# Patient Record
Sex: Female | Born: 1935 | Race: Black or African American | Hispanic: No | Smoking: Never smoker
Health system: Southern US, Community
[De-identification: ages and names within clinical notes are randomized; demographics above are authoritative.]

## PROBLEM LIST (undated history)

## (undated) DIAGNOSIS — N200 Calculus of kidney: Secondary | ICD-10-CM

## (undated) DIAGNOSIS — K859 Acute pancreatitis without necrosis or infection, unspecified: Secondary | ICD-10-CM

## (undated) DIAGNOSIS — I639 Cerebral infarction, unspecified: Secondary | ICD-10-CM

## (undated) DIAGNOSIS — Z89519 Acquired absence of unspecified leg below knee: Secondary | ICD-10-CM

## (undated) DIAGNOSIS — N390 Urinary tract infection, site not specified: Secondary | ICD-10-CM

## (undated) DIAGNOSIS — D649 Anemia, unspecified: Secondary | ICD-10-CM

## (undated) DIAGNOSIS — N183 Chronic kidney disease, stage 3 unspecified: Secondary | ICD-10-CM

## (undated) DIAGNOSIS — I10 Essential (primary) hypertension: Secondary | ICD-10-CM

## (undated) DIAGNOSIS — I4891 Unspecified atrial fibrillation: Secondary | ICD-10-CM

## (undated) HISTORY — PX: BELOW KNEE LEG AMPUTATION: SUR23

---

## 1998-01-30 ENCOUNTER — Ambulatory Visit (HOSPITAL_COMMUNITY): Admission: RE | Admit: 1998-01-30 | Discharge: 1998-01-30 | Payer: Self-pay | Admitting: Internal Medicine

## 1998-01-30 ENCOUNTER — Encounter: Admission: RE | Admit: 1998-01-30 | Discharge: 1998-01-30 | Payer: Self-pay | Admitting: Internal Medicine

## 1998-02-08 ENCOUNTER — Encounter: Payer: Self-pay | Admitting: Emergency Medicine

## 1998-02-08 ENCOUNTER — Emergency Department (HOSPITAL_COMMUNITY): Admission: EM | Admit: 1998-02-08 | Discharge: 1998-02-08 | Payer: Self-pay | Admitting: Emergency Medicine

## 1998-02-14 ENCOUNTER — Ambulatory Visit (HOSPITAL_COMMUNITY): Admission: RE | Admit: 1998-02-14 | Discharge: 1998-02-14 | Payer: Self-pay | Admitting: Orthopedic Surgery

## 1998-03-20 ENCOUNTER — Ambulatory Visit (HOSPITAL_COMMUNITY): Admission: RE | Admit: 1998-03-20 | Discharge: 1998-03-20 | Payer: Self-pay | Admitting: *Deleted

## 1998-03-20 ENCOUNTER — Encounter: Payer: Self-pay | Admitting: *Deleted

## 1998-04-26 ENCOUNTER — Encounter: Admission: RE | Admit: 1998-04-26 | Discharge: 1998-04-26 | Payer: Self-pay | Admitting: Internal Medicine

## 1998-05-17 ENCOUNTER — Encounter: Admission: RE | Admit: 1998-05-17 | Discharge: 1998-05-17 | Payer: Self-pay | Admitting: Internal Medicine

## 1998-05-18 ENCOUNTER — Emergency Department (HOSPITAL_COMMUNITY): Admission: EM | Admit: 1998-05-18 | Discharge: 1998-05-18 | Payer: Self-pay | Admitting: Emergency Medicine

## 1998-05-18 ENCOUNTER — Encounter: Payer: Self-pay | Admitting: Emergency Medicine

## 1998-06-06 ENCOUNTER — Encounter: Admission: RE | Admit: 1998-06-06 | Discharge: 1998-06-06 | Payer: Self-pay | Admitting: Internal Medicine

## 1999-04-05 ENCOUNTER — Encounter: Payer: Self-pay | Admitting: *Deleted

## 1999-04-05 ENCOUNTER — Ambulatory Visit (HOSPITAL_COMMUNITY): Admission: RE | Admit: 1999-04-05 | Discharge: 1999-04-05 | Payer: Self-pay | Admitting: *Deleted

## 2000-02-27 ENCOUNTER — Other Ambulatory Visit: Admission: RE | Admit: 2000-02-27 | Discharge: 2000-02-27 | Payer: Self-pay | Admitting: *Deleted

## 2000-04-07 ENCOUNTER — Ambulatory Visit (HOSPITAL_COMMUNITY): Admission: RE | Admit: 2000-04-07 | Discharge: 2000-04-07 | Payer: Self-pay | Admitting: *Deleted

## 2000-04-07 ENCOUNTER — Encounter: Payer: Self-pay | Admitting: *Deleted

## 2000-09-25 ENCOUNTER — Emergency Department (HOSPITAL_COMMUNITY): Admission: EM | Admit: 2000-09-25 | Discharge: 2000-09-25 | Payer: Self-pay | Admitting: Emergency Medicine

## 2002-01-25 ENCOUNTER — Encounter: Payer: Self-pay | Admitting: *Deleted

## 2002-01-25 ENCOUNTER — Encounter: Admission: RE | Admit: 2002-01-25 | Discharge: 2002-01-25 | Payer: Self-pay | Admitting: *Deleted

## 2002-01-27 ENCOUNTER — Ambulatory Visit: Admission: RE | Admit: 2002-01-27 | Discharge: 2002-01-27 | Payer: Self-pay | Admitting: *Deleted

## 2002-01-27 ENCOUNTER — Ambulatory Visit (HOSPITAL_BASED_OUTPATIENT_CLINIC_OR_DEPARTMENT_OTHER): Admission: RE | Admit: 2002-01-27 | Discharge: 2002-01-27 | Payer: Self-pay | Admitting: *Deleted

## 2002-05-18 ENCOUNTER — Encounter: Admission: RE | Admit: 2002-05-18 | Discharge: 2002-05-18 | Payer: Self-pay | Admitting: *Deleted

## 2002-05-18 ENCOUNTER — Encounter: Payer: Self-pay | Admitting: *Deleted

## 2002-05-19 ENCOUNTER — Ambulatory Visit (HOSPITAL_BASED_OUTPATIENT_CLINIC_OR_DEPARTMENT_OTHER): Admission: RE | Admit: 2002-05-19 | Discharge: 2002-05-19 | Payer: Self-pay | Admitting: *Deleted

## 2002-07-21 ENCOUNTER — Ambulatory Visit (HOSPITAL_BASED_OUTPATIENT_CLINIC_OR_DEPARTMENT_OTHER): Admission: RE | Admit: 2002-07-21 | Discharge: 2002-07-21 | Payer: Self-pay | Admitting: *Deleted

## 2003-02-25 ENCOUNTER — Other Ambulatory Visit: Admission: RE | Admit: 2003-02-25 | Discharge: 2003-02-25 | Payer: Self-pay | Admitting: Family Medicine

## 2004-04-02 ENCOUNTER — Ambulatory Visit (HOSPITAL_COMMUNITY): Admission: RE | Admit: 2004-04-02 | Discharge: 2004-04-02 | Payer: Self-pay | Admitting: Family Medicine

## 2004-04-04 ENCOUNTER — Encounter (INDEPENDENT_AMBULATORY_CARE_PROVIDER_SITE_OTHER): Payer: Self-pay | Admitting: Cardiology

## 2004-04-04 ENCOUNTER — Inpatient Hospital Stay (HOSPITAL_COMMUNITY): Admission: EM | Admit: 2004-04-04 | Discharge: 2004-04-06 | Payer: Self-pay | Admitting: Emergency Medicine

## 2005-05-06 ENCOUNTER — Emergency Department (HOSPITAL_COMMUNITY): Admission: EM | Admit: 2005-05-06 | Discharge: 2005-05-07 | Payer: Self-pay | Admitting: Emergency Medicine

## 2005-05-15 ENCOUNTER — Encounter: Admission: RE | Admit: 2005-05-15 | Discharge: 2005-05-15 | Payer: Self-pay | Admitting: Family Medicine

## 2005-06-06 ENCOUNTER — Encounter (HOSPITAL_BASED_OUTPATIENT_CLINIC_OR_DEPARTMENT_OTHER): Admission: RE | Admit: 2005-06-06 | Discharge: 2005-07-12 | Payer: Self-pay | Admitting: Surgery

## 2005-06-10 ENCOUNTER — Ambulatory Visit (HOSPITAL_COMMUNITY): Admission: RE | Admit: 2005-06-10 | Discharge: 2005-06-10 | Payer: Self-pay | Admitting: Surgery

## 2005-06-17 ENCOUNTER — Ambulatory Visit (HOSPITAL_COMMUNITY): Admission: RE | Admit: 2005-06-17 | Discharge: 2005-06-17 | Payer: Self-pay | Admitting: Surgery

## 2005-07-12 ENCOUNTER — Encounter (HOSPITAL_BASED_OUTPATIENT_CLINIC_OR_DEPARTMENT_OTHER): Admission: RE | Admit: 2005-07-12 | Discharge: 2005-08-01 | Payer: Self-pay | Admitting: Surgery

## 2005-07-27 ENCOUNTER — Emergency Department (HOSPITAL_COMMUNITY): Admission: EM | Admit: 2005-07-27 | Discharge: 2005-07-28 | Payer: Self-pay | Admitting: Emergency Medicine

## 2005-07-29 ENCOUNTER — Ambulatory Visit (HOSPITAL_COMMUNITY): Admission: RE | Admit: 2005-07-29 | Discharge: 2005-07-29 | Payer: Self-pay | Admitting: Surgery

## 2005-08-02 ENCOUNTER — Encounter (HOSPITAL_BASED_OUTPATIENT_CLINIC_OR_DEPARTMENT_OTHER): Admission: RE | Admit: 2005-08-02 | Discharge: 2005-08-22 | Payer: Self-pay | Admitting: Surgery

## 2005-09-30 ENCOUNTER — Encounter: Payer: Self-pay | Admitting: Vascular Surgery

## 2005-09-30 ENCOUNTER — Inpatient Hospital Stay (HOSPITAL_COMMUNITY): Admission: AD | Admit: 2005-09-30 | Discharge: 2005-10-09 | Payer: Self-pay | Admitting: Internal Medicine

## 2005-10-06 ENCOUNTER — Encounter (INDEPENDENT_AMBULATORY_CARE_PROVIDER_SITE_OTHER): Payer: Self-pay | Admitting: *Deleted

## 2005-10-08 ENCOUNTER — Ambulatory Visit: Payer: Self-pay | Admitting: Physical Medicine & Rehabilitation

## 2005-10-09 ENCOUNTER — Inpatient Hospital Stay (HOSPITAL_COMMUNITY)
Admission: RE | Admit: 2005-10-09 | Discharge: 2005-10-18 | Payer: Self-pay | Admitting: Physical Medicine & Rehabilitation

## 2005-12-02 ENCOUNTER — Ambulatory Visit: Payer: Self-pay | Admitting: Physical Medicine & Rehabilitation

## 2005-12-02 ENCOUNTER — Encounter
Admission: RE | Admit: 2005-12-02 | Discharge: 2006-03-02 | Payer: Self-pay | Admitting: Physical Medicine & Rehabilitation

## 2005-12-03 ENCOUNTER — Encounter
Admission: RE | Admit: 2005-12-03 | Discharge: 2005-12-03 | Payer: Self-pay | Admitting: Physical Medicine & Rehabilitation

## 2006-01-20 ENCOUNTER — Encounter
Admission: RE | Admit: 2006-01-20 | Discharge: 2006-03-02 | Payer: Self-pay | Admitting: Physical Medicine & Rehabilitation

## 2006-01-31 ENCOUNTER — Ambulatory Visit: Payer: Self-pay | Admitting: Physical Medicine & Rehabilitation

## 2006-01-31 ENCOUNTER — Encounter
Admission: RE | Admit: 2006-01-31 | Discharge: 2006-05-01 | Payer: Self-pay | Admitting: Physical Medicine & Rehabilitation

## 2006-03-03 ENCOUNTER — Encounter
Admission: RE | Admit: 2006-03-03 | Discharge: 2006-06-01 | Payer: Self-pay | Admitting: Physical Medicine & Rehabilitation

## 2006-05-15 ENCOUNTER — Ambulatory Visit: Payer: Self-pay | Admitting: Physical Medicine & Rehabilitation

## 2006-07-25 ENCOUNTER — Inpatient Hospital Stay (HOSPITAL_COMMUNITY): Admission: EM | Admit: 2006-07-25 | Discharge: 2006-07-28 | Payer: Self-pay | Admitting: Emergency Medicine

## 2006-08-12 ENCOUNTER — Ambulatory Visit: Admission: RE | Admit: 2006-08-12 | Discharge: 2006-08-12 | Payer: Self-pay | Admitting: Gynecology

## 2006-08-15 ENCOUNTER — Ambulatory Visit: Payer: Self-pay | Admitting: Physical Medicine & Rehabilitation

## 2006-08-15 ENCOUNTER — Encounter
Admission: RE | Admit: 2006-08-15 | Discharge: 2006-10-07 | Payer: Self-pay | Admitting: Physical Medicine & Rehabilitation

## 2006-12-02 ENCOUNTER — Encounter
Admission: RE | Admit: 2006-12-02 | Discharge: 2006-12-09 | Payer: Self-pay | Admitting: Physical Medicine & Rehabilitation

## 2006-12-02 ENCOUNTER — Ambulatory Visit: Payer: Self-pay | Admitting: Physical Medicine & Rehabilitation

## 2007-03-05 ENCOUNTER — Encounter
Admission: RE | Admit: 2007-03-05 | Discharge: 2007-06-03 | Payer: Self-pay | Admitting: Physical Medicine & Rehabilitation

## 2007-03-27 ENCOUNTER — Ambulatory Visit: Payer: Self-pay | Admitting: Physical Medicine & Rehabilitation

## 2007-06-18 ENCOUNTER — Encounter
Admission: RE | Admit: 2007-06-18 | Discharge: 2007-06-19 | Payer: Self-pay | Admitting: Physical Medicine & Rehabilitation

## 2007-06-19 ENCOUNTER — Ambulatory Visit: Payer: Self-pay | Admitting: Physical Medicine & Rehabilitation

## 2007-09-02 ENCOUNTER — Emergency Department (HOSPITAL_COMMUNITY): Admission: EM | Admit: 2007-09-02 | Discharge: 2007-09-03 | Payer: Self-pay | Admitting: Emergency Medicine

## 2007-10-16 ENCOUNTER — Encounter
Admission: RE | Admit: 2007-10-16 | Discharge: 2007-10-19 | Payer: Self-pay | Admitting: Physical Medicine & Rehabilitation

## 2007-10-19 ENCOUNTER — Ambulatory Visit: Payer: Self-pay | Admitting: Physical Medicine & Rehabilitation

## 2008-01-12 ENCOUNTER — Ambulatory Visit: Payer: Self-pay | Admitting: Physical Medicine & Rehabilitation

## 2008-01-12 ENCOUNTER — Encounter
Admission: RE | Admit: 2008-01-12 | Discharge: 2008-01-12 | Payer: Self-pay | Admitting: Physical Medicine & Rehabilitation

## 2008-06-23 ENCOUNTER — Inpatient Hospital Stay (HOSPITAL_COMMUNITY): Admission: EM | Admit: 2008-06-23 | Discharge: 2008-07-07 | Payer: Self-pay | Admitting: Emergency Medicine

## 2008-07-15 ENCOUNTER — Inpatient Hospital Stay (HOSPITAL_COMMUNITY): Admission: EM | Admit: 2008-07-15 | Discharge: 2008-07-23 | Payer: Self-pay | Admitting: Emergency Medicine

## 2008-07-20 ENCOUNTER — Encounter (INDEPENDENT_AMBULATORY_CARE_PROVIDER_SITE_OTHER): Payer: Self-pay | Admitting: General Surgery

## 2008-08-05 ENCOUNTER — Encounter: Admission: RE | Admit: 2008-08-05 | Discharge: 2008-08-05 | Payer: Self-pay | Admitting: General Surgery

## 2008-08-29 ENCOUNTER — Encounter
Admission: RE | Admit: 2008-08-29 | Discharge: 2008-11-27 | Payer: Self-pay | Admitting: Physical Medicine & Rehabilitation

## 2008-08-29 ENCOUNTER — Ambulatory Visit: Payer: Self-pay | Admitting: Physical Medicine & Rehabilitation

## 2008-09-19 ENCOUNTER — Encounter
Admission: RE | Admit: 2008-09-19 | Discharge: 2008-10-14 | Payer: Self-pay | Admitting: Physical Medicine & Rehabilitation

## 2008-11-03 ENCOUNTER — Inpatient Hospital Stay (HOSPITAL_COMMUNITY): Admission: EM | Admit: 2008-11-03 | Discharge: 2008-11-15 | Payer: Self-pay | Admitting: Emergency Medicine

## 2008-11-23 ENCOUNTER — Ambulatory Visit (HOSPITAL_COMMUNITY): Admission: RE | Admit: 2008-11-23 | Discharge: 2008-11-23 | Payer: Self-pay | Admitting: Urology

## 2008-12-20 ENCOUNTER — Encounter
Admission: RE | Admit: 2008-12-20 | Discharge: 2009-01-04 | Payer: Self-pay | Admitting: Physical Medicine & Rehabilitation

## 2008-12-21 ENCOUNTER — Ambulatory Visit: Payer: Self-pay | Admitting: Physical Medicine & Rehabilitation

## 2008-12-21 ENCOUNTER — Ambulatory Visit (HOSPITAL_COMMUNITY)
Admission: RE | Admit: 2008-12-21 | Discharge: 2008-12-21 | Payer: Self-pay | Admitting: Physical Medicine & Rehabilitation

## 2009-02-09 ENCOUNTER — Encounter
Admission: RE | Admit: 2009-02-09 | Discharge: 2009-02-13 | Payer: Self-pay | Admitting: Physical Medicine & Rehabilitation

## 2009-02-13 ENCOUNTER — Ambulatory Visit: Payer: Self-pay | Admitting: Physical Medicine & Rehabilitation

## 2009-05-03 ENCOUNTER — Encounter
Admission: RE | Admit: 2009-05-03 | Discharge: 2009-08-01 | Payer: Self-pay | Admitting: Physical Medicine & Rehabilitation

## 2009-05-08 ENCOUNTER — Ambulatory Visit: Payer: Self-pay | Admitting: Physical Medicine & Rehabilitation

## 2009-06-06 ENCOUNTER — Ambulatory Visit: Payer: Self-pay | Admitting: Physical Medicine & Rehabilitation

## 2009-07-11 ENCOUNTER — Emergency Department (HOSPITAL_COMMUNITY): Admission: EM | Admit: 2009-07-11 | Discharge: 2009-07-11 | Payer: Self-pay | Admitting: Emergency Medicine

## 2009-08-24 ENCOUNTER — Encounter
Admission: RE | Admit: 2009-08-24 | Discharge: 2009-11-15 | Payer: Self-pay | Admitting: Physical Medicine & Rehabilitation

## 2009-08-29 ENCOUNTER — Ambulatory Visit: Payer: Self-pay | Admitting: Physical Medicine & Rehabilitation

## 2009-10-23 ENCOUNTER — Emergency Department (HOSPITAL_COMMUNITY): Admission: EM | Admit: 2009-10-23 | Discharge: 2009-10-24 | Payer: Self-pay | Admitting: Emergency Medicine

## 2009-11-15 ENCOUNTER — Encounter
Admission: RE | Admit: 2009-11-15 | Discharge: 2009-11-21 | Payer: Self-pay | Source: Home / Self Care | Attending: Physical Medicine & Rehabilitation | Admitting: Physical Medicine & Rehabilitation

## 2009-11-21 ENCOUNTER — Ambulatory Visit: Payer: Self-pay | Admitting: Physical Medicine & Rehabilitation

## 2009-12-09 ENCOUNTER — Inpatient Hospital Stay (HOSPITAL_COMMUNITY)
Admission: EM | Admit: 2009-12-09 | Discharge: 2009-12-11 | Payer: Self-pay | Source: Home / Self Care | Attending: Internal Medicine | Admitting: Internal Medicine

## 2009-12-09 ENCOUNTER — Ambulatory Visit: Payer: Self-pay | Admitting: Cardiology

## 2009-12-10 ENCOUNTER — Encounter (INDEPENDENT_AMBULATORY_CARE_PROVIDER_SITE_OTHER): Payer: Self-pay | Admitting: Internal Medicine

## 2009-12-14 ENCOUNTER — Inpatient Hospital Stay (HOSPITAL_COMMUNITY)
Admission: EM | Admit: 2009-12-14 | Discharge: 2009-12-19 | Payer: Self-pay | Source: Home / Self Care | Attending: Internal Medicine | Admitting: Internal Medicine

## 2010-01-02 ENCOUNTER — Inpatient Hospital Stay (HOSPITAL_COMMUNITY)
Admission: EM | Admit: 2010-01-02 | Discharge: 2010-01-09 | Payer: Self-pay | Source: Home / Self Care | Attending: Internal Medicine | Admitting: Internal Medicine

## 2010-02-06 NOTE — Discharge Summary (Signed)
[name], [name]             ACCOUNT NO.:  [id number]  MEDICAL RECORD NO.:  [id number]          PATIENT TYPE:  INP  LOCATION:  1513                         FACILITY:  [facility]  PHYSICIAN:  Kela Millin, M.D.DATE OF BIRTH:  October 01, 1927  DATE OF ADMISSION:  01/02/2010 DATE OF DISCHARGE:  01/09/2010                        DISCHARGE SUMMARY - REFERRING   DISCHARGE DIAGNOSES: 1. Diastolic heart failure, new onset. 2. Hospital acquired pneumonia. 3. Coumadin toxicity/supratherapeutic INR with significant epistaxis     as a result requiring transfusion of packed red blood cells. 4. Epistaxis - resolved. 5. Acute blood loss anemia - secondary to epistaxis, hemoglobin stable     status post transfusion. 6. Atrial fibrillation - rate controlled, Coumadin discontinued     secondary to the above bleeding complication and concern about the     patient's safety due to lack of understanding stenting on taking as     directed as well as the patient and family's wishes to discontinue. 7. Chronic kidney disease, stage III - stable. 8. Hypokalemia - resolved. 9. Hypertension 10.History of left above-knee amputation secondary to osteomyelitis. 11.History of kidney stones and status post stent placement in the     past and removal and lithotripsy of the left kidney stones. 12.History of pancreatitis secondary to gallstones.  PROCEDURES AND STUDIES: 1. Chest x-ray on January 02, 2010 - bilateral interstitial and air     space disease.  Either pulmonary edema or multifocal infection.     Cardiomegaly accentuated by a low lung volumes. 2. Echocardiogram on December 28 - ejection fraction 60% to 65%,     systolic function normal, wall motion normal.  No regional wall     motion abnormalities.  The right atrium was moderately dilated.     The left atrium also moderately dilated.  Doppler parameters     consistent with high ventricular filling pressure.  Technically,     not sufficient to  allow evaluation of LV diastolic function.  BRIEF HISTORY: 1. The patient is an 77 year old white female with the above-listed     medical problems who had recently been discharged from the hospital     on December 12 following treatment for an E-coli urinary tract     infection and new onset atrial fibrillation at the time and was     discharged on Coumadin.  It was also noted that the patient did     have a history of iron-deficiency anemia.  She reported that she     had been doing well at home until the day prior to presentation     when she developed shortness of breath which progressively got     worse until the day of admission when she was taken to the [facility] ER.  She denied chest pains, palpitations, also denied nausea,     vomiting, and diarrhea.  A chest x-ray was obtained in the ED and     the results as stated above.  She was admitted for further     evaluation and management. 2. Hospital-acquired pneumonia - upon admission, the patient was     placed  on empiric broad-spectrum antibiotics after blood cultures     were obtained.  She was also placed on supplemental oxygen.  Her     symptoms gradually improved and she was remaining afebrile with no     leukocytosis and so her antibiotics were de-escalated.  As noted     above, her chest x-ray also showed findings consistent with edema     and so she was diuresed as well and with these interventions, her     O2 requirements gradually improved as she was getting diuresed as     well.  Prior to discharge, she was satting well on room air - 93%     to 98%.  She was discharged on oral antibiotics to complete the     treatment course. 3. Diastolic heart failure, new onset - as discussed above, the     patient presented with shortness of breath and a chest x-ray     revealed bilateral interstitial and air space disease - pulmonary     edema or multifocal infection.  A brain natriuretic peptide was     done and it was  elevated at 437 and cardiac enzymes were cycled and     they came back negative for an MI.  The patient was started on IV     Lasix for diuresis and a 2-D echocardiogram was done and the     results as stated above.  The patient was placed on Coreg as well     and ACE inhibitors were avoided because of her chronic kidney     disease.  With the above interventions, her symptoms resolved.  Her     brain natriuretic peptide improved to 217 and the supplemental     oxygen was discontinued as she was oxygenating well on room air.     She was discharged for outpatient follow up with her PCP and     cardiology. 4. Supratherapeutic INR/Coumadin toxicity - the patient's INR on     admission was found to be 5.18, and also the patient had initially     indicated to the admitting MD that the primary care physician had     discontinued her Coumadin.  On further talking with the patient     after she was admitted, she stated that she had still been taking     the Coumadin.  I also talked with the patient's primary care     physician, Dr. Cliffton Asters and she stated that she had asked the patient     to hold off Coumadin and she was supposed to follow up with her for     recheck of the PT/INR and further dosing of her Coumadin, but she     stated that the patient did not follow up with her.  Her Coumadin     was held in the hospital but she developed epistaxis on December 29     and initially it had seemed to be mild and gauze was placed per     nursing staff and since her INR was subtherapeutic at the time and     the bleeding appeared to have slowed down, she was given vitamin K.     Subsequently that evening, the bleeding worsened and she was given     fresh frozen plasma and ENT was consulted by the on-call physician     and Dr. Jearld Fenton saw the patient.  Her INR had reached a peak of 6.91  in the hospital and following the fresh frozen plasma and vitamin     K, it went down to 1.50 on recheck on December  30.  The patient's     hemoglobin was also monitored and it dropped to a low of 7.2 and so     she was transfused a unit of packed red blood cells and following     that, it improved to 8.3 and subsequently on recheck prior to     discharge was 9.9. 5. Epistaxis - as discussed above. 6. Atrial fibrillation - her rate was controlled on Coreg during this     hospital stay.  As discussed above, her INR was supratherapeutic     and so the Coumadin was held and subsequently reversed after the     bleeding/epistaxis discussed above.  Subsequently, I talked with     the patient's primary care physician regarding her Coumadin and she     clarified again as noted above that she had not discontinued her     Coumadin.  When she had followed up with her outpatient in that she     had asked her to hold off and follow up with her for a recheck of     the PT/INR but she did not.  The patient stated that she had to be     hospitalized but indicated that she had continued to take the     Coumadin.  On discussing this with Dr. Cliffton Asters, her impression was     that the patient was unsafe using the Coumadin since she did not     appear to understand the instructions that she has been given and I     agreed with this.  Also the patient's son who is the power of     attorney stated that he did not want the patient to be on Coumadin     because of all the problems that she had been having with it, and     the patient also agreed to this and so the Coumadin was     discontinued.  She was placed on aspirin after the bleeding above     had resolved and she was discharged on this. 7. Chronic kidney disease - remained stable during her hospital stay -     her last creatinine prior to discharge was 2.11 with a BUN of 29. 8. Hypokalemia - her potassium was replaced during this hospital stay. 9. Status post left above-knee amputation - PT/OT was consulted and     followed the patient during her hospital  stay.  DISCHARGE MEDICATIONS: 1. Lasix 40 mg p.o. daily. 2. Guaifenesin 600 mg p.o. b.i.d. 3. Levaquin 250 mg p.o. daily for 3 more days. 4. Multivitamins 1 p.o. daily. 5. KCl 10 mEq p.o. every other day. 6. Xopenex 2 puffs q.4-6 h. p.r.n. 7. Enteric-coated aspirin 325 mg p.o. daily. 8. Coreg 3.125 mg 1 p.o. b.i.d. 9. Iron sulfate 325 mg p.o. daily. 10.Gabapentin 300 mg one p.o. daily. 11.Benadryl 25 mg q.6 h. p.r.n. 12.Isosorbide dinitrate 30mg  p.o. daily  FOLLOWUP CARE:  Dr. Laurann Montana in 1 to 2 weeks.  The patient is to call for appointment.  DISCHARGE CONDITION:  Improved, stable.     Kela Millin, M.D.     ACV/MEDQ  D:  02/01/2010  T:  02/01/2010  Job:  [id number]  cc:   [name]. Cliffton Asters, M.D. Fax: [phone]  Electronically Signed by Donnalee Curry M.D. on 02/06/2010 03:27:54  PM

## 2010-02-13 ENCOUNTER — Ambulatory Visit: Payer: Self-pay | Admitting: Physical Medicine & Rehabilitation

## 2010-02-13 ENCOUNTER — Ambulatory Visit: Payer: Medicare Other | Attending: Physical Medicine & Rehabilitation

## 2010-03-19 LAB — APTT: aPTT: 36 seconds (ref 24–37)

## 2010-03-19 LAB — POCT CARDIAC MARKERS
CKMB, poc: 5.4 ng/mL (ref 1.0–8.0)
Myoglobin, poc: 237 ng/mL (ref 12–200)

## 2010-03-19 LAB — BASIC METABOLIC PANEL
BUN: 29 mg/dL — ABNORMAL HIGH (ref 6–23)
BUN: 29 mg/dL — ABNORMAL HIGH (ref 6–23)
BUN: 29 mg/dL — ABNORMAL HIGH (ref 6–23)
BUN: 19 mg/dL (ref 6–23)
BUN: 24 mg/dL — ABNORMAL HIGH (ref 6–23)
CO2: 30 mEq/L (ref 19–32)
CO2: 33 mEq/L — ABNORMAL HIGH (ref 19–32)
CO2: 33 mEq/L — ABNORMAL HIGH (ref 19–32)
CO2: 21 mEq/L (ref 19–32)
CO2: 22 mEq/L (ref 19–32)
CO2: 24 mEq/L (ref 19–32)
CO2: 27 mEq/L (ref 19–32)
Calcium: 8.8 mg/dL (ref 8.4–10.5)
Calcium: 9.2 mg/dL (ref 8.4–10.5)
Chloride: 102 mEq/L (ref 96–112)
Chloride: 99 mEq/L (ref 96–112)
Chloride: 99 mEq/L (ref 96–112)
Chloride: 106 mEq/L (ref 96–112)
Chloride: 106 mEq/L (ref 96–112)
Chloride: 111 mEq/L (ref 96–112)
Chloride: 112 mEq/L (ref 96–112)
Creatinine, Ser: 1.97 mg/dL — ABNORMAL HIGH (ref 0.4–1.2)
Creatinine, Ser: 2.1 mg/dL — ABNORMAL HIGH (ref 0.4–1.2)
Creatinine, Ser: 2.12 mg/dL — ABNORMAL HIGH (ref 0.4–1.2)
Creatinine, Ser: 1.89 mg/dL — ABNORMAL HIGH (ref 0.4–1.2)
GFR calc Af Amer: 27 mL/min — ABNORMAL LOW (ref >60)
GFR calc Af Amer: 30 mL/min — ABNORMAL LOW (ref >60)
GFR calc Af Amer: 25 mL/min — ABNORMAL LOW (ref >60)
GFR calc non Af Amer: 24 mL/min — ABNORMAL LOW (ref >60)
GFR calc non Af Amer: 26 mL/min — ABNORMAL LOW (ref >60)
Glucose, Bld: 94 mg/dL (ref 70–99)
Glucose, Bld: 94 mg/dL (ref 70–99)
Glucose, Bld: 103 mg/dL — ABNORMAL HIGH (ref 70–99)
Glucose, Bld: 104 mg/dL — ABNORMAL HIGH (ref 70–99)
Glucose, Bld: 104 mg/dL — ABNORMAL HIGH (ref 70–99)
Glucose, Bld: 132 mg/dL — ABNORMAL HIGH (ref 70–99)
Potassium: 3.7 mEq/L (ref 3.5–5.1)
Potassium: 3.4 mEq/L — ABNORMAL LOW (ref 3.5–5.1)
Potassium: 3.5 mEq/L (ref 3.5–5.1)
Potassium: 3.9 mEq/L (ref 3.5–5.1)
Potassium: 4.1 mEq/L (ref 3.5–5.1)
Sodium: 142 mEq/L (ref 135–145)
Sodium: 141 mEq/L (ref 135–145)
Sodium: 142 mEq/L (ref 135–145)
Sodium: 143 mEq/L (ref 135–145)

## 2010-03-19 LAB — COMPREHENSIVE METABOLIC PANEL
ALT: 10 U/L (ref 0–35)
ALT: 8 U/L (ref 0–35)
AST: 14 U/L (ref 0–37)
AST: 25 U/L (ref 0–37)
Albumin: 3 g/dL — ABNORMAL LOW (ref 3.5–5.2)
Albumin: 3.1 g/dL — ABNORMAL LOW (ref 3.5–5.2)
Alkaline Phosphatase: 85 U/L (ref 39–117)
Alkaline Phosphatase: 82 U/L (ref 39–117)
BUN: 23 mg/dL (ref 6–23)
BUN: 25 mg/dL — ABNORMAL HIGH (ref 6–23)
CO2: 21 mEq/L (ref 19–32)
CO2: 21 mEq/L (ref 19–32)
CO2: 23 mEq/L (ref 19–32)
Calcium: 9 mg/dL (ref 8.4–10.5)
Calcium: 9.3 mg/dL (ref 8.4–10.5)
Chloride: 107 mEq/L (ref 96–112)
Chloride: 110 mEq/L (ref 96–112)
Chloride: 111 mEq/L (ref 96–112)
Creatinine, Ser: 1.87 mg/dL — ABNORMAL HIGH (ref 0.4–1.2)
Creatinine, Ser: 2.01 mg/dL — ABNORMAL HIGH (ref 0.4–1.2)
Creatinine, Ser: 2.04 mg/dL — ABNORMAL HIGH (ref 0.4–1.2)
GFR calc Af Amer: 28 mL/min — ABNORMAL LOW (ref >60)
GFR calc Af Amer: 31 mL/min — ABNORMAL LOW (ref >60)
GFR calc non Af Amer: 26 mL/min — ABNORMAL LOW (ref >60)
GFR calc non Af Amer: 24 mL/min — ABNORMAL LOW (ref >60)
GFR calc non Af Amer: 26 mL/min — ABNORMAL LOW (ref >60)
Glucose, Bld: 119 mg/dL — ABNORMAL HIGH (ref 70–99)
Glucose, Bld: 117 mg/dL — ABNORMAL HIGH (ref 70–99)
Glucose, Bld: 97 mg/dL (ref 70–99)
Potassium: 3.2 mEq/L — ABNORMAL LOW (ref 3.5–5.1)
Sodium: 143 mEq/L (ref 135–145)
Sodium: 142 mEq/L (ref 135–145)
Total Bilirubin: 0.8 mg/dL (ref 0.3–1.2)
Total Bilirubin: 0.3 mg/dL (ref 0.3–1.2)
Total Bilirubin: 0.5 mg/dL (ref 0.3–1.2)
Total Bilirubin: 0.7 mg/dL (ref 0.3–1.2)
Total Protein: 6.3 g/dL (ref 6.0–8.3)

## 2010-03-19 LAB — CBC
HCT: 29.9 % — ABNORMAL LOW (ref 36.0–46.0)
HCT: 30.2 % — ABNORMAL LOW (ref 36.0–46.0)
HCT: 32.3 % — ABNORMAL LOW (ref 36.0–46.0)
HCT: 30.3 % — ABNORMAL LOW (ref 36.0–46.0)
HCT: 30.9 % — ABNORMAL LOW (ref 36.0–46.0)
HCT: 32 % — ABNORMAL LOW (ref 36.0–46.0)
HCT: 32.6 % — ABNORMAL LOW (ref 36.0–46.0)
HCT: 33.1 % — ABNORMAL LOW (ref 36.0–46.0)
HCT: 33.7 % — ABNORMAL LOW (ref 36.0–46.0)
Hemoglobin: 9.4 g/dL — ABNORMAL LOW (ref 12.0–15.0)
Hemoglobin: 9.4 g/dL — ABNORMAL LOW (ref 12.0–15.0)
Hemoglobin: 9.9 g/dL — ABNORMAL LOW (ref 12.0–15.0)
Hemoglobin: 10.2 g/dL — ABNORMAL LOW (ref 12.0–15.0)
Hemoglobin: 10.6 g/dL — ABNORMAL LOW (ref 12.0–15.0)
Hemoglobin: 10.6 g/dL — ABNORMAL LOW (ref 12.0–15.0)
Hemoglobin: 11.9 g/dL — ABNORMAL LOW (ref 12.0–15.0)
Hemoglobin: 9.6 g/dL — ABNORMAL LOW (ref 12.0–15.0)
MCH: 24.6 pg — ABNORMAL LOW (ref 26.0–34.0)
MCH: 25 pg — ABNORMAL LOW (ref 26.0–34.0)
MCH: 25.1 pg — ABNORMAL LOW (ref 26.0–34.0)
MCH: 25 pg — ABNORMAL LOW (ref 26.0–34.0)
MCH: 25.1 pg — ABNORMAL LOW (ref 26.0–34.0)
MCH: 25.3 pg — ABNORMAL LOW (ref 26.0–34.0)
MCH: 25.4 pg — ABNORMAL LOW (ref 26.0–34.0)
MCH: 25.5 pg — ABNORMAL LOW (ref 26.0–34.0)
MCH: 25.7 pg — ABNORMAL LOW (ref 26.0–34.0)
MCHC: 30.7 g/dL (ref 30.0–36.0)
MCHC: 31.1 g/dL (ref 30.0–36.0)
MCHC: 31.2 g/dL (ref 30.0–36.0)
MCHC: 31.4 g/dL (ref 30.0–36.0)
MCHC: 31.3 g/dL (ref 30.0–36.0)
MCHC: 31.7 g/dL (ref 30.0–36.0)
MCHC: 32 g/dL (ref 30.0–36.0)
MCHC: 32 g/dL (ref 30.0–36.0)
MCHC: 32.5 g/dL (ref 30.0–36.0)
MCV: 78.5 fL (ref 78.0–100.0)
MCV: 79.8 fL (ref 78.0–100.0)
MCV: 80.3 fL (ref 78.0–100.0)
MCV: 79.2 fL (ref 78.0–100.0)
MCV: 79.9 fL (ref 78.0–100.0)
MCV: 80 fL (ref 78.0–100.0)
MCV: 80.7 fL (ref 78.0–100.0)
Platelets: 125 10*3/uL — ABNORMAL LOW (ref 150–400)
Platelets: 149 10*3/uL — ABNORMAL LOW (ref 150–400)
Platelets: 172 10*3/uL (ref 150–400)
Platelets: 197 10*3/uL (ref 150–400)
Platelets: 205 10*3/uL (ref 150–400)
Platelets: 206 10*3/uL (ref 150–400)
Platelets: 212 10*3/uL (ref 150–400)
RBC: 3.32 MIL/uL — ABNORMAL LOW (ref 3.87–5.11)
RBC: 4.02 MIL/uL (ref 3.87–5.11)
RBC: 3.83 MIL/uL — ABNORMAL LOW (ref 3.87–5.11)
RBC: 4.03 MIL/uL (ref 3.87–5.11)
RBC: 4.15 MIL/uL (ref 3.87–5.11)
RBC: 4.24 MIL/uL (ref 3.87–5.11)
RDW: 14.8 % (ref 11.5–15.5)
RDW: 14.9 % (ref 11.5–15.5)
RDW: 14.2 % (ref 11.5–15.5)
RDW: 14.4 % (ref 11.5–15.5)
RDW: 14.7 % (ref 11.5–15.5)
WBC: 5.3 10*3/uL (ref 4.0–10.5)
WBC: 6.6 10*3/uL (ref 4.0–10.5)
WBC: 7.9 10*3/uL (ref 4.0–10.5)

## 2010-03-19 LAB — POCT I-STAT, CHEM 8
BUN: 27 mg/dL — ABNORMAL HIGH (ref 6–23)
BUN: 27 mg/dL — ABNORMAL HIGH (ref 6–23)
Calcium, Ion: 1.18 mmol/L (ref 1.12–1.32)
Calcium, Ion: 1.2 mmol/L (ref 1.12–1.32)
Chloride: 118 mEq/L — ABNORMAL HIGH (ref 96–112)
Glucose, Bld: 121 mg/dL — ABNORMAL HIGH (ref 70–99)
HCT: 29 % — ABNORMAL LOW (ref 36.0–46.0)
HCT: 35 % — ABNORMAL LOW (ref 36.0–46.0)
HCT: 37 % (ref 36.0–46.0)
Hemoglobin: 12.6 g/dL (ref 12.0–15.0)
Potassium: 3.4 mEq/L — ABNORMAL LOW (ref 3.5–5.1)
Sodium: 141 mEq/L (ref 135–145)
TCO2: 21 mmol/L (ref 0–100)
TCO2: 23 mmol/L (ref 0–100)

## 2010-03-19 LAB — PROTIME-INR
INR: 1.2 (ref 0.00–1.49)
INR: 1.37 (ref 0.00–1.49)
INR: 2.27 — ABNORMAL HIGH (ref 0.00–1.49)
INR: 6.91 (ref 0.00–1.49)
INR: 1.1 (ref 0.00–1.49)
INR: 1.11 (ref 0.00–1.49)
INR: 1.11 (ref 0.00–1.49)
INR: 1.18 (ref 0.00–1.49)
INR: 1.27 (ref 0.00–1.49)
Prothrombin Time: 15.4 seconds — ABNORMAL HIGH (ref 11.6–15.2)
Prothrombin Time: 15.5 seconds — ABNORMAL HIGH (ref 11.6–15.2)
Prothrombin Time: 18.3 seconds — ABNORMAL HIGH (ref 11.6–15.2)
Prothrombin Time: 25.2 seconds — ABNORMAL HIGH (ref 11.6–15.2)
Prothrombin Time: 15.2 seconds (ref 11.6–15.2)

## 2010-03-19 LAB — DIFFERENTIAL
Basophils Absolute: 0 10*3/uL (ref 0.0–0.1)
Basophils Absolute: 0 10*3/uL (ref 0.0–0.1)
Basophils Absolute: 0 10*3/uL (ref 0.0–0.1)
Basophils Absolute: 0 10*3/uL (ref 0.0–0.1)
Basophils Relative: 0 % (ref 0–1)
Basophils Relative: 0 % (ref 0–1)
Basophils Relative: 0 % (ref 0–1)
Eosinophils Absolute: 0.1 10*3/uL (ref 0.0–0.7)
Eosinophils Absolute: 0.3 10*3/uL (ref 0.0–0.7)
Eosinophils Relative: 2 % (ref 0–5)
Eosinophils Relative: 4 % (ref 0–5)
Lymphocytes Relative: 18 % (ref 12–46)
Lymphocytes Relative: 19 % (ref 12–46)
Lymphocytes Relative: 20 % (ref 12–46)
Lymphs Abs: 1.3 10*3/uL (ref 0.7–4.0)
Monocytes Absolute: 0.3 10*3/uL (ref 0.1–1.0)
Monocytes Absolute: 0.4 10*3/uL (ref 0.1–1.0)
Monocytes Absolute: 0.5 10*3/uL (ref 0.1–1.0)
Monocytes Absolute: 0.8 10*3/uL (ref 0.1–1.0)
Monocytes Relative: 6 % (ref 3–12)
Monocytes Relative: 11 % (ref 3–12)
Monocytes Relative: 7 % (ref 3–12)
Neutro Abs: 5.2 10*3/uL (ref 1.7–7.7)
Neutro Abs: 6.3 10*3/uL (ref 1.7–7.7)
Neutro Abs: 7.3 10*3/uL (ref 1.7–7.7)
Neutrophils Relative %: 77 % (ref 43–77)
Neutrophils Relative %: 74 % (ref 43–77)

## 2010-03-19 LAB — BLOOD GAS, ARTERIAL
Bicarbonate: 18.3 mEq/L — ABNORMAL LOW (ref 20.0–24.0)
pCO2 arterial: 33.8 mmHg — ABNORMAL LOW (ref 35.0–45.0)
pH, Arterial: 7.353 (ref 7.350–7.400)
pO2, Arterial: 86.7 mmHg (ref 80.0–100.0)

## 2010-03-19 LAB — LIPASE, BLOOD
Lipase: 27 U/L (ref 11–59)
Lipase: 30 U/L (ref 11–59)
Lipase: 39 U/L (ref 11–59)

## 2010-03-19 LAB — PREPARE FRESH FROZEN PLASMA
Unit division: 0
Unit division: 0
Unit division: 0

## 2010-03-19 LAB — T4, FREE: Free T4: 1.03 ng/dL (ref 0.80–1.80)

## 2010-03-19 LAB — CARDIAC PANEL(CRET KIN+CKTOT+MB+TROPI)
CK, MB: 3.3 ng/mL (ref 0.3–4.0)
CK, MB: 1.5 ng/mL (ref 0.3–4.0)
CK, MB: 2.2 ng/mL (ref 0.3–4.0)
CK, MB: 2.4 ng/mL (ref 0.3–4.0)
Relative Index: 2.4 (ref 0.0–2.5)
Relative Index: INVALID (ref 0.0–2.5)
Relative Index: INVALID (ref 0.0–2.5)
Relative Index: INVALID (ref 0.0–2.5)
Relative Index: INVALID (ref 0.0–2.5)
Total CK: 47 U/L (ref 7–177)
Troponin I: 0.03 ng/mL (ref 0.00–0.06)
Troponin I: 0.02 ng/mL (ref 0.00–0.06)
Troponin I: 0.03 ng/mL (ref 0.00–0.06)
Troponin I: 0.03 ng/mL (ref 0.00–0.06)

## 2010-03-19 LAB — PREPARE RBC (CROSSMATCH)

## 2010-03-19 LAB — CK TOTAL AND CKMB (NOT AT ARMC)
CK, MB: 1.7 ng/mL (ref 0.3–4.0)
CK, MB: 2 ng/mL (ref 0.3–4.0)
Relative Index: INVALID (ref 0.0–2.5)
Relative Index: INVALID (ref 0.0–2.5)
Total CK: 97 U/L (ref 7–177)

## 2010-03-19 LAB — HEMOGLOBIN A1C: Hgb A1c MFr Bld: 5.9 % — ABNORMAL HIGH (ref <5.7)

## 2010-03-19 LAB — URINE MICROSCOPIC-ADD ON

## 2010-03-19 LAB — TYPE AND SCREEN: Unit division: 0

## 2010-03-19 LAB — URINALYSIS, ROUTINE W REFLEX MICROSCOPIC
Bilirubin Urine: NEGATIVE
Glucose, UA: NEGATIVE mg/dL
Ketones, ur: NEGATIVE mg/dL
Specific Gravity, Urine: 1.009 (ref 1.005–1.030)
pH: 7 (ref 5.0–8.0)

## 2010-03-19 LAB — URINE CULTURE
Colony Count: NO GROWTH
Culture  Setup Time: 201112040217
Special Requests: NEGATIVE

## 2010-03-19 LAB — BRAIN NATRIURETIC PEPTIDE
Pro B Natriuretic peptide (BNP): 217 pg/mL — ABNORMAL HIGH (ref 0.0–100.0)
Pro B Natriuretic peptide (BNP): 437 pg/mL — ABNORMAL HIGH (ref 0.0–100.0)

## 2010-03-19 LAB — HEPATIC FUNCTION PANEL
AST: 16 U/L (ref 0–37)
Albumin: 3 g/dL — ABNORMAL LOW (ref 3.5–5.2)
Albumin: 3.4 g/dL — ABNORMAL LOW (ref 3.5–5.2)
Alkaline Phosphatase: 75 U/L (ref 39–117)
Bilirubin, Direct: 0.1 mg/dL (ref 0.0–0.3)
Indirect Bilirubin: 0.5 mg/dL (ref 0.3–0.9)
Total Bilirubin: 0.6 mg/dL (ref 0.3–1.2)
Total Bilirubin: 0.7 mg/dL (ref 0.3–1.2)
Total Protein: 7.2 g/dL (ref 6.0–8.3)

## 2010-03-19 LAB — CULTURE, BLOOD (ROUTINE X 2): Culture  Setup Time: 201112281037

## 2010-03-19 LAB — LIPID PANEL
LDL Cholesterol: 77 mg/dL (ref 0–99)
Triglycerides: 136 mg/dL (ref <150)
Triglycerides: 162 mg/dL — ABNORMAL HIGH (ref <150)

## 2010-03-19 LAB — TROPONIN I
Troponin I: 0.02 ng/mL (ref 0.00–0.06)
Troponin I: 0.02 ng/mL (ref 0.00–0.06)

## 2010-03-19 LAB — IRON AND TIBC: UIBC: 181 ug/dL

## 2010-03-19 LAB — URINALYSIS, MICROSCOPIC ONLY
Nitrite: NEGATIVE
Urobilinogen, UA: 0.2 mg/dL (ref 0.0–1.0)

## 2010-03-19 LAB — ABO/RH: ABO/RH(D): O POS

## 2010-03-19 LAB — MRSA PCR SCREENING: MRSA by PCR: NEGATIVE

## 2010-03-19 LAB — TSH: TSH: 4.982 u[IU]/mL — ABNORMAL HIGH (ref 0.350–4.500)

## 2010-03-19 LAB — FERRITIN: Ferritin: 78 ng/mL (ref 10–291)

## 2010-03-19 LAB — D-DIMER, QUANTITATIVE: D-Dimer, Quant: 0.43 ug/mL-FEU (ref 0.00–0.48)

## 2010-03-21 LAB — BASIC METABOLIC PANEL
BUN: 11 mg/dL (ref 6–23)
GFR calc non Af Amer: 29 mL/min — ABNORMAL LOW (ref >60)
Glucose, Bld: 122 mg/dL — ABNORMAL HIGH (ref 70–99)
Potassium: 3.5 mEq/L (ref 3.5–5.1)

## 2010-03-21 LAB — URINE CULTURE

## 2010-03-21 LAB — CBC
HCT: 38.9 % (ref 36.0–46.0)
MCH: 26.5 pg (ref 26.0–34.0)
MCHC: 33.2 g/dL (ref 30.0–36.0)
MCV: 80 fL (ref 78.0–100.0)
RDW: 15.2 % (ref 11.5–15.5)

## 2010-03-21 LAB — URINE MICROSCOPIC-ADD ON

## 2010-03-21 LAB — DIFFERENTIAL
Basophils Absolute: 0 10*3/uL (ref 0.0–0.1)
Basophils Relative: 0 % (ref 0–1)
Eosinophils Absolute: 0.3 10*3/uL (ref 0.0–0.7)
Eosinophils Relative: 4 % (ref 0–5)
Monocytes Absolute: 0.8 10*3/uL (ref 0.1–1.0)
Neutro Abs: 5.6 10*3/uL (ref 1.7–7.7)

## 2010-03-21 LAB — HEPATIC FUNCTION PANEL
Albumin: 3.5 g/dL (ref 3.5–5.2)
Alkaline Phosphatase: 88 U/L (ref 39–117)
Total Bilirubin: 0.4 mg/dL (ref 0.3–1.2)

## 2010-03-21 LAB — URINALYSIS, ROUTINE W REFLEX MICROSCOPIC
Bilirubin Urine: NEGATIVE
Glucose, UA: 100 mg/dL — AB
Ketones, ur: NEGATIVE mg/dL
Nitrite: NEGATIVE
pH: 6 (ref 5.0–8.0)

## 2010-03-21 LAB — LIPASE, BLOOD: Lipase: 49 U/L (ref 11–59)

## 2010-04-11 LAB — BASIC METABOLIC PANEL
BUN: 19 mg/dL (ref 6–23)
CO2: 21 mEq/L (ref 19–32)
CO2: 22 mEq/L (ref 19–32)
CO2: 26 mEq/L (ref 19–32)
Calcium: 8.2 mg/dL — ABNORMAL LOW (ref 8.4–10.5)
Calcium: 8.7 mg/dL (ref 8.4–10.5)
Calcium: 8.7 mg/dL (ref 8.4–10.5)
Chloride: 106 mEq/L (ref 96–112)
Chloride: 112 mEq/L (ref 96–112)
Creatinine, Ser: 1.17 mg/dL (ref 0.4–1.2)
GFR calc Af Amer: 54 mL/min — ABNORMAL LOW (ref >60)
GFR calc Af Amer: 59 mL/min — ABNORMAL LOW (ref >60)
GFR calc non Af Amer: 33 mL/min — ABNORMAL LOW (ref >60)
GFR calc non Af Amer: 45 mL/min — ABNORMAL LOW (ref >60)
Glucose, Bld: 108 mg/dL — ABNORMAL HIGH (ref 70–99)
Glucose, Bld: 72 mg/dL (ref 70–99)
Glucose, Bld: 74 mg/dL (ref 70–99)
Glucose, Bld: 93 mg/dL (ref 70–99)
Sodium: 137 mEq/L (ref 135–145)
Sodium: 138 mEq/L (ref 135–145)
Sodium: 140 mEq/L (ref 135–145)

## 2010-04-11 LAB — CBC
HCT: 30.6 % — ABNORMAL LOW (ref 36.0–46.0)
HCT: 25.3 % — ABNORMAL LOW (ref 36.0–46.0)
HCT: 25.5 % — ABNORMAL LOW (ref 36.0–46.0)
HCT: 26 % — ABNORMAL LOW (ref 36.0–46.0)
Hemoglobin: 9.7 g/dL — ABNORMAL LOW (ref 12.0–15.0)
Hemoglobin: 8 g/dL — ABNORMAL LOW (ref 12.0–15.0)
Hemoglobin: 8.3 g/dL — ABNORMAL LOW (ref 12.0–15.0)
Hemoglobin: 8.4 g/dL — ABNORMAL LOW (ref 12.0–15.0)
Hemoglobin: 8.6 g/dL — ABNORMAL LOW (ref 12.0–15.0)
Hemoglobin: 9.4 g/dL — ABNORMAL LOW (ref 12.0–15.0)
MCHC: 31.8 g/dL (ref 30.0–36.0)
MCHC: 32.8 g/dL (ref 30.0–36.0)
MCHC: 32.9 g/dL (ref 30.0–36.0)
MCHC: 33.2 g/dL (ref 30.0–36.0)
MCHC: 33.3 g/dL (ref 30.0–36.0)
MCHC: 33.3 g/dL (ref 30.0–36.0)
MCV: 77.3 fL — ABNORMAL LOW (ref 78.0–100.0)
MCV: 77.4 fL — ABNORMAL LOW (ref 78.0–100.0)
MCV: 77.5 fL — ABNORMAL LOW (ref 78.0–100.0)
MCV: 77.7 fL — ABNORMAL LOW (ref 78.0–100.0)
Platelets: 67 10*3/uL — ABNORMAL LOW (ref 150–400)
RBC: 3.11 MIL/uL — ABNORMAL LOW (ref 3.87–5.11)
RBC: 3.25 MIL/uL — ABNORMAL LOW (ref 3.87–5.11)
RBC: 3.29 MIL/uL — ABNORMAL LOW (ref 3.87–5.11)
RBC: 3.36 MIL/uL — ABNORMAL LOW (ref 3.87–5.11)
RDW: 16.3 % — ABNORMAL HIGH (ref 11.5–15.5)
RDW: 16.7 % — ABNORMAL HIGH (ref 11.5–15.5)
RDW: 16.8 % — ABNORMAL HIGH (ref 11.5–15.5)
RDW: 16.9 % — ABNORMAL HIGH (ref 11.5–15.5)
RDW: 17 % — ABNORMAL HIGH (ref 11.5–15.5)
WBC: 5.9 10*3/uL (ref 4.0–10.5)
WBC: 7.5 10*3/uL (ref 4.0–10.5)

## 2010-04-11 LAB — COMPREHENSIVE METABOLIC PANEL
BUN: 8 mg/dL (ref 6–23)
CO2: 20 mEq/L (ref 19–32)
Calcium: 8.5 mg/dL (ref 8.4–10.5)
Chloride: 111 mEq/L (ref 96–112)
Creatinine, Ser: 1.3 mg/dL — ABNORMAL HIGH (ref 0.4–1.2)
GFR calc non Af Amer: 40 mL/min — ABNORMAL LOW (ref >60)
Glucose, Bld: 91 mg/dL (ref 70–99)
Total Bilirubin: 0.6 mg/dL (ref 0.3–1.2)

## 2010-04-11 LAB — IRON AND TIBC

## 2010-04-11 LAB — C-REACTIVE PROTEIN: CRP: 6.1 mg/dL — ABNORMAL HIGH (ref <0.6)

## 2010-04-11 LAB — URINALYSIS, MICROSCOPIC ONLY
Bilirubin Urine: NEGATIVE
Ketones, ur: NEGATIVE mg/dL
Protein, ur: NEGATIVE mg/dL
Urobilinogen, UA: 0.2 mg/dL (ref 0.0–1.0)

## 2010-04-12 LAB — COMPREHENSIVE METABOLIC PANEL
ALT: 8 U/L (ref 0–35)
Alkaline Phosphatase: 73 U/L (ref 39–117)
Chloride: 109 mEq/L (ref 96–112)
GFR calc Af Amer: 28 mL/min — ABNORMAL LOW (ref >60)
GFR calc non Af Amer: 23 mL/min — ABNORMAL LOW (ref >60)
Total Bilirubin: 0.8 mg/dL (ref 0.3–1.2)

## 2010-04-12 LAB — URINALYSIS, MICROSCOPIC ONLY
Ketones, ur: NEGATIVE mg/dL
Nitrite: NEGATIVE
Protein, ur: 100 mg/dL — AB

## 2010-04-12 LAB — DIFFERENTIAL
Basophils Absolute: 0 10*3/uL (ref 0.0–0.1)
Basophils Relative: 0 % (ref 0–1)
Eosinophils Absolute: 0 10*3/uL (ref 0.0–0.7)
Eosinophils Relative: 0 % (ref 0–5)
Eosinophils Relative: 0 % (ref 0–5)
Lymphocytes Relative: 3 % — ABNORMAL LOW (ref 12–46)
Lymphs Abs: 0.4 10*3/uL — ABNORMAL LOW (ref 0.7–4.0)
Lymphs Abs: 1.2 10*3/uL (ref 0.7–4.0)
Monocytes Absolute: 0.4 10*3/uL (ref 0.1–1.0)
Monocytes Absolute: 0.6 10*3/uL (ref 0.1–1.0)
Monocytes Relative: 2 % — ABNORMAL LOW (ref 3–12)
Neutro Abs: 12.7 10*3/uL — ABNORMAL HIGH (ref 1.7–7.7)
Neutro Abs: 27.4 10*3/uL — ABNORMAL HIGH (ref 1.7–7.7)
WBC Morphology: INCREASED
WBC Morphology: INCREASED

## 2010-04-12 LAB — BASIC METABOLIC PANEL
BUN: 15 mg/dL (ref 6–23)
CO2: 17 mEq/L — ABNORMAL LOW (ref 19–32)
CO2: 17 mEq/L — ABNORMAL LOW (ref 19–32)
CO2: 19 mEq/L (ref 19–32)
Chloride: 109 mEq/L (ref 96–112)
Chloride: 115 mEq/L — ABNORMAL HIGH (ref 96–112)
Creatinine, Ser: 1.84 mg/dL — ABNORMAL HIGH (ref 0.4–1.2)
GFR calc Af Amer: 32 mL/min — ABNORMAL LOW (ref >60)
GFR calc Af Amer: 36 mL/min — ABNORMAL LOW (ref >60)
GFR calc non Af Amer: 27 mL/min — ABNORMAL LOW (ref >60)
Glucose, Bld: 122 mg/dL — ABNORMAL HIGH (ref 70–99)
Glucose, Bld: 88 mg/dL (ref 70–99)
Glucose, Bld: 96 mg/dL (ref 70–99)
Potassium: 2.9 mEq/L — ABNORMAL LOW (ref 3.5–5.1)
Potassium: 3.4 mEq/L — ABNORMAL LOW (ref 3.5–5.1)
Potassium: 4.1 mEq/L (ref 3.5–5.1)
Sodium: 135 mEq/L (ref 135–145)
Sodium: 138 mEq/L (ref 135–145)

## 2010-04-12 LAB — GRAM STAIN

## 2010-04-12 LAB — CBC
HCT: 26.9 % — ABNORMAL LOW (ref 36.0–46.0)
HCT: 27.1 % — ABNORMAL LOW (ref 36.0–46.0)
HCT: 29.9 % — ABNORMAL LOW (ref 36.0–46.0)
HCT: 35.9 % — ABNORMAL LOW (ref 36.0–46.0)
Hemoglobin: 12.1 g/dL (ref 12.0–15.0)
Hemoglobin: 9 g/dL — ABNORMAL LOW (ref 12.0–15.0)
Hemoglobin: 9 g/dL — ABNORMAL LOW (ref 12.0–15.0)
MCHC: 33.5 g/dL (ref 30.0–36.0)
MCV: 76.5 fL — ABNORMAL LOW (ref 78.0–100.0)
MCV: 77.5 fL — ABNORMAL LOW (ref 78.0–100.0)
MCV: 78.1 fL (ref 78.0–100.0)
Platelets: 139 10*3/uL — ABNORMAL LOW (ref 150–400)
Platelets: 87 10*3/uL — ABNORMAL LOW (ref 150–400)
RBC: 3.47 MIL/uL — ABNORMAL LOW (ref 3.87–5.11)
RBC: 3.47 MIL/uL — ABNORMAL LOW (ref 3.87–5.11)
RDW: 16.2 % — ABNORMAL HIGH (ref 11.5–15.5)
RDW: 16.9 % — ABNORMAL HIGH (ref 11.5–15.5)
RDW: 17.1 % — ABNORMAL HIGH (ref 11.5–15.5)
WBC: 20.8 10*3/uL — ABNORMAL HIGH (ref 4.0–10.5)
WBC: 29.2 10*3/uL — ABNORMAL HIGH (ref 4.0–10.5)

## 2010-04-12 LAB — URINALYSIS, ROUTINE W REFLEX MICROSCOPIC
Glucose, UA: NEGATIVE mg/dL
Protein, ur: 100 mg/dL — AB
Specific Gravity, Urine: 1.018 (ref 1.005–1.030)
pH: 6 (ref 5.0–8.0)

## 2010-04-12 LAB — URINE CULTURE

## 2010-04-12 LAB — CULTURE, BLOOD (ROUTINE X 2): Culture: NO GROWTH

## 2010-04-12 LAB — URINE MICROSCOPIC-ADD ON

## 2010-04-12 LAB — BODY FLUID CULTURE

## 2010-04-12 LAB — APTT: aPTT: 32 seconds (ref 24–37)

## 2010-04-12 LAB — PROTIME-INR: INR: 1.26 (ref 0.00–1.49)

## 2010-04-12 LAB — ANAEROBIC CULTURE

## 2010-04-13 ENCOUNTER — Emergency Department (HOSPITAL_COMMUNITY)
Admission: EM | Admit: 2010-04-13 | Discharge: 2010-04-13 | Disposition: A | Discharge status: Home / Self Care | Payer: Medicare Other | Attending: Emergency Medicine | Admitting: Emergency Medicine

## 2010-04-13 DIAGNOSIS — I10 Essential (primary) hypertension: Secondary | ICD-10-CM | POA: Insufficient documentation

## 2010-04-13 DIAGNOSIS — Z79899 Other long term (current) drug therapy: Secondary | ICD-10-CM | POA: Insufficient documentation

## 2010-04-13 DIAGNOSIS — S78119A Complete traumatic amputation at level between unspecified hip and knee, initial encounter: Secondary | ICD-10-CM | POA: Insufficient documentation

## 2010-04-13 DIAGNOSIS — M79609 Pain in unspecified limb: Secondary | ICD-10-CM | POA: Insufficient documentation

## 2010-04-13 DIAGNOSIS — Z7982 Long term (current) use of aspirin: Secondary | ICD-10-CM | POA: Insufficient documentation

## 2010-04-13 DIAGNOSIS — IMO0002 Reserved for concepts with insufficient information to code with codable children: Secondary | ICD-10-CM | POA: Insufficient documentation

## 2010-04-13 DIAGNOSIS — R110 Nausea: Secondary | ICD-10-CM | POA: Insufficient documentation

## 2010-04-13 DIAGNOSIS — I739 Peripheral vascular disease, unspecified: Secondary | ICD-10-CM | POA: Insufficient documentation

## 2010-04-13 LAB — CBC
MCH: 23.8 pg — ABNORMAL LOW (ref 26.0–34.0)
MCHC: 31.8 g/dL (ref 30.0–36.0)
MCV: 75 fL — ABNORMAL LOW (ref 78.0–100.0)
Platelets: 109 10*3/uL — ABNORMAL LOW (ref 150–400)

## 2010-04-13 LAB — DIFFERENTIAL
Basophils Relative: 0 % (ref 0–1)
Eosinophils Absolute: 0.2 10*3/uL (ref 0.0–0.7)
Lymphs Abs: 1.5 10*3/uL (ref 0.7–4.0)
Monocytes Absolute: 1.1 10*3/uL — ABNORMAL HIGH (ref 0.1–1.0)
Monocytes Relative: 9 % (ref 3–12)

## 2010-04-13 LAB — BASIC METABOLIC PANEL
BUN: 14 mg/dL (ref 6–23)
CO2: 18 mEq/L — ABNORMAL LOW (ref 19–32)
Calcium: 9 mg/dL (ref 8.4–10.5)
Chloride: 112 mEq/L (ref 96–112)
Creatinine, Ser: 1.53 mg/dL — ABNORMAL HIGH (ref 0.4–1.2)
GFR calc Af Amer: 40 mL/min — ABNORMAL LOW (ref >60)

## 2010-04-13 LAB — CK: Total CK: 92 U/L (ref 7–177)

## 2010-04-15 LAB — COMPREHENSIVE METABOLIC PANEL
ALT: 10 U/L (ref 0–35)
ALT: 12 U/L (ref 0–35)
AST: 22 U/L (ref 0–37)
Albumin: 2.1 g/dL — ABNORMAL LOW (ref 3.5–5.2)
Albumin: 2.5 g/dL — ABNORMAL LOW (ref 3.5–5.2)
Albumin: 3 g/dL — ABNORMAL LOW (ref 3.5–5.2)
Alkaline Phosphatase: 65 U/L (ref 39–117)
Alkaline Phosphatase: 71 U/L (ref 39–117)
Alkaline Phosphatase: 80 U/L (ref 39–117)
BUN: 3 mg/dL — ABNORMAL LOW (ref 6–23)
BUN: 22 mg/dL (ref 6–23)
BUN: 5 mg/dL — ABNORMAL LOW (ref 6–23)
BUN: 7 mg/dL (ref 6–23)
CO2: 23 mEq/L (ref 19–32)
CO2: 21 mEq/L (ref 19–32)
Calcium: 8.4 mg/dL (ref 8.4–10.5)
Calcium: 8.9 mg/dL (ref 8.4–10.5)
Chloride: 109 mEq/L (ref 96–112)
Chloride: 105 mEq/L (ref 96–112)
Chloride: 107 mEq/L (ref 96–112)
Creatinine, Ser: 1.02 mg/dL (ref 0.4–1.2)
GFR calc Af Amer: >60 mL/min (ref >60)
GFR calc non Af Amer: 52 mL/min — ABNORMAL LOW (ref >60)
GFR calc non Af Amer: 20 mL/min — ABNORMAL LOW (ref >60)
Glucose, Bld: 74 mg/dL (ref 70–99)
Glucose, Bld: 95 mg/dL (ref 70–99)
Potassium: 3.6 mEq/L (ref 3.5–5.1)
Potassium: 3.6 mEq/L (ref 3.5–5.1)
Potassium: 4.1 mEq/L (ref 3.5–5.1)
Potassium: 4.6 mEq/L (ref 3.5–5.1)
Sodium: 135 mEq/L (ref 135–145)
Sodium: 138 mEq/L (ref 135–145)
Sodium: 140 mEq/L (ref 135–145)
Total Bilirubin: 0.7 mg/dL (ref 0.3–1.2)
Total Bilirubin: 1.1 mg/dL (ref 0.3–1.2)
Total Bilirubin: 1.4 mg/dL — ABNORMAL HIGH (ref 0.3–1.2)
Total Protein: 5.8 g/dL — ABNORMAL LOW (ref 6.0–8.3)
Total Protein: 7.4 g/dL (ref 6.0–8.3)
Total Protein: 8.6 g/dL — ABNORMAL HIGH (ref 6.0–8.3)

## 2010-04-15 LAB — URINALYSIS, ROUTINE W REFLEX MICROSCOPIC
Glucose, UA: NEGATIVE mg/dL
Hgb urine dipstick: NEGATIVE
Ketones, ur: NEGATIVE mg/dL
pH: 5.5 (ref 5.0–8.0)

## 2010-04-15 LAB — CBC
HCT: 26.9 % — ABNORMAL LOW (ref 36.0–46.0)
HCT: 34.5 % — ABNORMAL LOW (ref 36.0–46.0)
Hemoglobin: 10.1 g/dL — ABNORMAL LOW (ref 12.0–15.0)
Hemoglobin: 11.6 g/dL — ABNORMAL LOW (ref 12.0–15.0)
Hemoglobin: 9 g/dL — ABNORMAL LOW (ref 12.0–15.0)
MCHC: 33.3 g/dL (ref 30.0–36.0)
Platelets: 143 10*3/uL — ABNORMAL LOW (ref 150–400)
Platelets: 160 10*3/uL (ref 150–400)
RBC: 4.27 MIL/uL (ref 3.87–5.11)
RDW: 14.2 % (ref 11.5–15.5)
RDW: 14.3 % (ref 11.5–15.5)
WBC: 5.7 10*3/uL (ref 4.0–10.5)

## 2010-04-15 LAB — DIFFERENTIAL
Basophils Absolute: 0.2 10*3/uL — ABNORMAL HIGH (ref 0.0–0.1)
Basophils Relative: 2 % — ABNORMAL HIGH (ref 0–1)
Eosinophils Absolute: 0.3 10*3/uL (ref 0.0–0.7)
Monocytes Relative: 8 % (ref 3–12)
Neutro Abs: 4.9 10*3/uL (ref 1.7–7.7)
Neutrophils Relative %: 58 % (ref 43–77)

## 2010-04-15 LAB — BASIC METABOLIC PANEL
BUN: 10 mg/dL (ref 6–23)
BUN: 15 mg/dL (ref 6–23)
BUN: 7 mg/dL (ref 6–23)
CO2: 20 mEq/L (ref 19–32)
CO2: 22 mEq/L (ref 19–32)
Calcium: 8.9 mg/dL (ref 8.4–10.5)
Chloride: 107 mEq/L (ref 96–112)
Chloride: 109 mEq/L (ref 96–112)
Creatinine, Ser: 1.09 mg/dL (ref 0.4–1.2)
GFR calc Af Amer: 59 mL/min — ABNORMAL LOW (ref >60)
Glucose, Bld: 108 mg/dL — ABNORMAL HIGH (ref 70–99)
Glucose, Bld: 77 mg/dL (ref 70–99)
Glucose, Bld: 92 mg/dL (ref 70–99)
Potassium: 4 mEq/L (ref 3.5–5.1)
Potassium: 4.9 mEq/L (ref 3.5–5.1)

## 2010-04-15 LAB — POCT CARDIAC MARKERS

## 2010-04-15 LAB — LIPASE, BLOOD: Lipase: 53 U/L (ref 11–59)

## 2010-04-15 LAB — URINE MICROSCOPIC-ADD ON

## 2010-04-15 LAB — AMYLASE: Amylase: 89 U/L (ref 27–131)

## 2010-04-16 LAB — COMPREHENSIVE METABOLIC PANEL
ALT: 11 U/L (ref 0–35)
ALT: 12 U/L (ref 0–35)
ALT: 12 U/L (ref 0–35)
ALT: 15 U/L (ref 0–35)
ALT: 15 U/L (ref 0–35)
ALT: <8 U/L (ref 0–35)
ALT: <8 U/L (ref 0–35)
ALT: <8 U/L (ref 0–35)
ALT: <8 U/L (ref 0–35)
AST: 108 U/L — ABNORMAL HIGH (ref 0–37)
AST: 21 U/L (ref 0–37)
AST: 21 U/L (ref 0–37)
AST: 21 U/L (ref 0–37)
AST: 23 U/L (ref 0–37)
AST: 30 U/L (ref 0–37)
AST: 31 U/L (ref 0–37)
AST: 38 U/L — ABNORMAL HIGH (ref 0–37)
AST: 53 U/L — ABNORMAL HIGH (ref 0–37)
Albumin: 2.2 g/dL — ABNORMAL LOW (ref 3.5–5.2)
Albumin: 2.2 g/dL — ABNORMAL LOW (ref 3.5–5.2)
Albumin: 2.2 g/dL — ABNORMAL LOW (ref 3.5–5.2)
Albumin: 2.3 g/dL — ABNORMAL LOW (ref 3.5–5.2)
Albumin: 2.3 g/dL — ABNORMAL LOW (ref 3.5–5.2)
Albumin: 2.6 g/dL — ABNORMAL LOW (ref 3.5–5.2)
Albumin: 2.9 g/dL — ABNORMAL LOW (ref 3.5–5.2)
Alkaline Phosphatase: 57 U/L (ref 39–117)
Alkaline Phosphatase: 58 U/L (ref 39–117)
Alkaline Phosphatase: 59 U/L (ref 39–117)
Alkaline Phosphatase: 61 U/L (ref 39–117)
Alkaline Phosphatase: 68 U/L (ref 39–117)
Alkaline Phosphatase: 70 U/L (ref 39–117)
Alkaline Phosphatase: 82 U/L (ref 39–117)
BUN: 10 mg/dL (ref 6–23)
BUN: 16 mg/dL (ref 6–23)
BUN: 22 mg/dL (ref 6–23)
BUN: 7 mg/dL (ref 6–23)
CO2: 19 mEq/L (ref 19–32)
CO2: 19 mEq/L (ref 19–32)
CO2: 24 mEq/L (ref 19–32)
CO2: 24 mEq/L (ref 19–32)
CO2: 29 mEq/L (ref 19–32)
CO2: 29 mEq/L (ref 19–32)
Calcium: 7.6 mg/dL — ABNORMAL LOW (ref 8.4–10.5)
Calcium: 8.2 mg/dL — ABNORMAL LOW (ref 8.4–10.5)
Calcium: 8.8 mg/dL (ref 8.4–10.5)
Calcium: 8.8 mg/dL (ref 8.4–10.5)
Calcium: 8.9 mg/dL (ref 8.4–10.5)
Calcium: 9.1 mg/dL (ref 8.4–10.5)
Calcium: 9.5 mg/dL (ref 8.4–10.5)
Chloride: 100 mEq/L (ref 96–112)
Chloride: 102 mEq/L (ref 96–112)
Chloride: 103 mEq/L (ref 96–112)
Chloride: 109 mEq/L (ref 96–112)
Chloride: 110 mEq/L (ref 96–112)
Chloride: 113 mEq/L — ABNORMAL HIGH (ref 96–112)
Chloride: 113 mEq/L — ABNORMAL HIGH (ref 96–112)
Creatinine, Ser: 1.41 mg/dL — ABNORMAL HIGH (ref 0.4–1.2)
Creatinine, Ser: 1.55 mg/dL — ABNORMAL HIGH (ref 0.4–1.2)
GFR calc Af Amer: 36 mL/min — ABNORMAL LOW (ref >60)
GFR calc Af Amer: 37 mL/min — ABNORMAL LOW (ref >60)
GFR calc Af Amer: 38 mL/min — ABNORMAL LOW (ref >60)
GFR calc Af Amer: 42 mL/min — ABNORMAL LOW (ref >60)
GFR calc Af Amer: 44 mL/min — ABNORMAL LOW (ref >60)
GFR calc Af Amer: 51 mL/min — ABNORMAL LOW (ref >60)
GFR calc Af Amer: 56 mL/min — ABNORMAL LOW (ref >60)
GFR calc Af Amer: 56 mL/min — ABNORMAL LOW (ref >60)
GFR calc Af Amer: 56 mL/min — ABNORMAL LOW (ref >60)
GFR calc non Af Amer: 32 mL/min — ABNORMAL LOW (ref >60)
GFR calc non Af Amer: 36 mL/min — ABNORMAL LOW (ref >60)
GFR calc non Af Amer: 42 mL/min — ABNORMAL LOW (ref >60)
GFR calc non Af Amer: 47 mL/min — ABNORMAL LOW (ref >60)
GFR calc non Af Amer: 47 mL/min — ABNORMAL LOW (ref >60)
Glucose, Bld: 102 mg/dL — ABNORMAL HIGH (ref 70–99)
Glucose, Bld: 103 mg/dL — ABNORMAL HIGH (ref 70–99)
Glucose, Bld: 106 mg/dL — ABNORMAL HIGH (ref 70–99)
Glucose, Bld: 135 mg/dL — ABNORMAL HIGH (ref 70–99)
Glucose, Bld: 60 mg/dL — ABNORMAL LOW (ref 70–99)
Glucose, Bld: 70 mg/dL (ref 70–99)
Glucose, Bld: 73 mg/dL (ref 70–99)
Glucose, Bld: 76 mg/dL (ref 70–99)
Glucose, Bld: 84 mg/dL (ref 70–99)
Potassium: 3.2 mEq/L — ABNORMAL LOW (ref 3.5–5.1)
Potassium: 3.4 mEq/L — ABNORMAL LOW (ref 3.5–5.1)
Potassium: 3.7 mEq/L (ref 3.5–5.1)
Potassium: 3.9 mEq/L (ref 3.5–5.1)
Potassium: 4.2 mEq/L (ref 3.5–5.1)
Potassium: 4.4 mEq/L (ref 3.5–5.1)
Potassium: 4.6 mEq/L (ref 3.5–5.1)
Potassium: 4.9 mEq/L (ref 3.5–5.1)
Sodium: 136 mEq/L (ref 135–145)
Sodium: 136 mEq/L (ref 135–145)
Sodium: 136 mEq/L (ref 135–145)
Sodium: 137 mEq/L (ref 135–145)
Sodium: 137 mEq/L (ref 135–145)
Sodium: 138 mEq/L (ref 135–145)
Sodium: 141 mEq/L (ref 135–145)
Sodium: 141 mEq/L (ref 135–145)
Total Bilirubin: 1.2 mg/dL (ref 0.3–1.2)
Total Bilirubin: 1.2 mg/dL (ref 0.3–1.2)
Total Bilirubin: 1.5 mg/dL — ABNORMAL HIGH (ref 0.3–1.2)
Total Bilirubin: 2.6 mg/dL — ABNORMAL HIGH (ref 0.3–1.2)
Total Bilirubin: 3.1 mg/dL — ABNORMAL HIGH (ref 0.3–1.2)
Total Bilirubin: 4.7 mg/dL — ABNORMAL HIGH (ref 0.3–1.2)
Total Protein: 5.8 g/dL — ABNORMAL LOW (ref 6.0–8.3)
Total Protein: 6 g/dL (ref 6.0–8.3)
Total Protein: 6.3 g/dL (ref 6.0–8.3)
Total Protein: 6.3 g/dL (ref 6.0–8.3)
Total Protein: 6.5 g/dL (ref 6.0–8.3)
Total Protein: 6.7 g/dL (ref 6.0–8.3)

## 2010-04-16 LAB — LIPID PANEL
Cholesterol: 124 mg/dL (ref 0–200)
HDL: 21 mg/dL — ABNORMAL LOW (ref >39)
HDL: <10 mg/dL — ABNORMAL LOW (ref >39)
LDL Cholesterol: 80 mg/dL (ref 0–99)
Total CHOL/HDL Ratio: 6.2 RATIO
Triglycerides: 150 mg/dL — ABNORMAL HIGH (ref <150)
Triglycerides: 186 mg/dL — ABNORMAL HIGH (ref <150)
VLDL: 30 mg/dL (ref 0–40)
VLDL: 37 mg/dL (ref 0–40)

## 2010-04-16 LAB — LIPASE, BLOOD
Lipase: 141 U/L — ABNORMAL HIGH (ref 11–59)
Lipase: 178 U/L — ABNORMAL HIGH (ref 11–59)
Lipase: 35 U/L (ref 11–59)
Lipase: 90 U/L — ABNORMAL HIGH (ref 11–59)
Lipase: >2000 U/L — ABNORMAL HIGH (ref 11–59)

## 2010-04-16 LAB — BASIC METABOLIC PANEL
BUN: 9 mg/dL (ref 6–23)
CO2: 22 mEq/L (ref 19–32)
Calcium: 8.8 mg/dL (ref 8.4–10.5)
Chloride: 113 mEq/L — ABNORMAL HIGH (ref 96–112)
Creatinine, Ser: 1.14 mg/dL (ref 0.4–1.2)
Creatinine, Ser: 1.16 mg/dL (ref 0.4–1.2)
Creatinine, Ser: 1.21 mg/dL — ABNORMAL HIGH (ref 0.4–1.2)
GFR calc Af Amer: 52 mL/min — ABNORMAL LOW (ref >60)
GFR calc non Af Amer: 46 mL/min — ABNORMAL LOW (ref >60)
Glucose, Bld: 118 mg/dL — ABNORMAL HIGH (ref 70–99)
Glucose, Bld: 96 mg/dL (ref 70–99)
Potassium: 4.1 mEq/L (ref 3.5–5.1)
Sodium: 140 mEq/L (ref 135–145)

## 2010-04-16 LAB — CBC
HCT: 28.1 % — ABNORMAL LOW (ref 36.0–46.0)
HCT: 29.1 % — ABNORMAL LOW (ref 36.0–46.0)
HCT: 29.3 % — ABNORMAL LOW (ref 36.0–46.0)
HCT: 29.8 % — ABNORMAL LOW (ref 36.0–46.0)
HCT: 30.9 % — ABNORMAL LOW (ref 36.0–46.0)
HCT: 36.6 % (ref 36.0–46.0)
HCT: 38.6 % (ref 36.0–46.0)
Hemoglobin: 10 g/dL — ABNORMAL LOW (ref 12.0–15.0)
Hemoglobin: 10 g/dL — ABNORMAL LOW (ref 12.0–15.0)
Hemoglobin: 10 g/dL — ABNORMAL LOW (ref 12.0–15.0)
Hemoglobin: 10.1 g/dL — ABNORMAL LOW (ref 12.0–15.0)
Hemoglobin: 9.5 g/dL — ABNORMAL LOW (ref 12.0–15.0)
Hemoglobin: 9.6 g/dL — ABNORMAL LOW (ref 12.0–15.0)
Hemoglobin: 9.8 g/dL — ABNORMAL LOW (ref 12.0–15.0)
MCHC: 33.1 g/dL (ref 30.0–36.0)
MCHC: 33.3 g/dL (ref 30.0–36.0)
MCHC: 33.3 g/dL (ref 30.0–36.0)
MCHC: 33.3 g/dL (ref 30.0–36.0)
MCHC: 33.5 g/dL (ref 30.0–36.0)
MCHC: 33.6 g/dL (ref 30.0–36.0)
MCHC: 33.6 g/dL (ref 30.0–36.0)
MCHC: 34.4 g/dL (ref 30.0–36.0)
MCV: 80.6 fL (ref 78.0–100.0)
MCV: 81 fL (ref 78.0–100.0)
MCV: 81.1 fL (ref 78.0–100.0)
MCV: 81.2 fL (ref 78.0–100.0)
MCV: 81.5 fL (ref 78.0–100.0)
Platelets: 137 10*3/uL — ABNORMAL LOW (ref 150–400)
Platelets: 140 10*3/uL — ABNORMAL LOW (ref 150–400)
Platelets: 142 10*3/uL — ABNORMAL LOW (ref 150–400)
Platelets: 151 10*3/uL (ref 150–400)
Platelets: 216 10*3/uL (ref 150–400)
Platelets: 246 10*3/uL (ref 150–400)
Platelets: 296 10*3/uL (ref 150–400)
RBC: 3.45 MIL/uL — ABNORMAL LOW (ref 3.87–5.11)
RBC: 3.47 MIL/uL — ABNORMAL LOW (ref 3.87–5.11)
RBC: 3.58 MIL/uL — ABNORMAL LOW (ref 3.87–5.11)
RBC: 3.64 MIL/uL — ABNORMAL LOW (ref 3.87–5.11)
RBC: 3.72 MIL/uL — ABNORMAL LOW (ref 3.87–5.11)
RBC: 3.73 MIL/uL — ABNORMAL LOW (ref 3.87–5.11)
RBC: 4.54 MIL/uL (ref 3.87–5.11)
RBC: 4.79 MIL/uL (ref 3.87–5.11)
RDW: 14.6 % (ref 11.5–15.5)
RDW: 14.7 % (ref 11.5–15.5)
RDW: 15.2 % (ref 11.5–15.5)
RDW: 15.2 % (ref 11.5–15.5)
RDW: 15.2 % (ref 11.5–15.5)
RDW: 15.3 % (ref 11.5–15.5)
WBC: 11.5 10*3/uL — ABNORMAL HIGH (ref 4.0–10.5)
WBC: 11.7 10*3/uL — ABNORMAL HIGH (ref 4.0–10.5)
WBC: 11.9 10*3/uL — ABNORMAL HIGH (ref 4.0–10.5)
WBC: 13.1 10*3/uL — ABNORMAL HIGH (ref 4.0–10.5)
WBC: 13.2 10*3/uL — ABNORMAL HIGH (ref 4.0–10.5)
WBC: 18.3 10*3/uL — ABNORMAL HIGH (ref 4.0–10.5)
WBC: 7.4 10*3/uL (ref 4.0–10.5)
WBC: 9.4 10*3/uL (ref 4.0–10.5)
WBC: 9.9 10*3/uL (ref 4.0–10.5)

## 2010-04-16 LAB — URINALYSIS, MICROSCOPIC ONLY
Bilirubin Urine: NEGATIVE
Leukocytes, UA: NEGATIVE
Nitrite: NEGATIVE
Specific Gravity, Urine: 1.015 (ref 1.005–1.030)
Urobilinogen, UA: 0.2 mg/dL (ref 0.0–1.0)

## 2010-04-16 LAB — URINALYSIS, ROUTINE W REFLEX MICROSCOPIC
Bilirubin Urine: NEGATIVE
Ketones, ur: NEGATIVE mg/dL
Specific Gravity, Urine: 1.011 (ref 1.005–1.030)
Urobilinogen, UA: 1 mg/dL (ref 0.0–1.0)

## 2010-04-16 LAB — AMYLASE
Amylase: 124 U/L (ref 27–131)
Amylase: 130 U/L (ref 27–131)
Amylase: 1644 U/L — ABNORMAL HIGH (ref 27–131)
Amylase: 212 U/L — ABNORMAL HIGH (ref 27–131)

## 2010-04-16 LAB — HEPATIC FUNCTION PANEL
Alkaline Phosphatase: 56 U/L (ref 39–117)
Bilirubin, Direct: 0.6 mg/dL — ABNORMAL HIGH (ref 0.0–0.3)
Indirect Bilirubin: 0.7 mg/dL (ref 0.3–0.9)
Total Protein: 6.4 g/dL (ref 6.0–8.3)

## 2010-04-16 LAB — POCT CARDIAC MARKERS: CKMB, poc: 1.5 ng/mL (ref 1.0–8.0)

## 2010-04-16 LAB — DIFFERENTIAL
Basophils Absolute: 0 10*3/uL (ref 0.0–0.1)
Eosinophils Relative: 1 % (ref 0–5)
Lymphocytes Relative: 12 % (ref 12–46)
Lymphocytes Relative: 17 % (ref 12–46)
Lymphs Abs: 1.6 10*3/uL (ref 0.7–4.0)
Monocytes Absolute: 0.8 10*3/uL (ref 0.1–1.0)
Monocytes Relative: 9 % (ref 3–12)
Neutro Abs: 6.6 10*3/uL (ref 1.7–7.7)
Neutro Abs: 7.5 10*3/uL (ref 1.7–7.7)
Neutrophils Relative %: 70 % (ref 43–77)
Neutrophils Relative %: 80 % — ABNORMAL HIGH (ref 43–77)

## 2010-04-16 LAB — CLOSTRIDIUM DIFFICILE EIA: C difficile Toxins A+B, EIA: NEGATIVE

## 2010-04-16 LAB — MAGNESIUM
Magnesium: 1.8 mg/dL (ref 1.5–2.5)
Magnesium: 1.9 mg/dL (ref 1.5–2.5)

## 2010-04-16 LAB — BRAIN NATRIURETIC PEPTIDE
Pro B Natriuretic peptide (BNP): 161 pg/mL — ABNORMAL HIGH (ref 0.0–100.0)
Pro B Natriuretic peptide (BNP): 267 pg/mL — ABNORMAL HIGH (ref 0.0–100.0)
Pro B Natriuretic peptide (BNP): 416 pg/mL — ABNORMAL HIGH (ref 0.0–100.0)
Pro B Natriuretic peptide (BNP): 542 pg/mL — ABNORMAL HIGH (ref 0.0–100.0)

## 2010-04-16 LAB — CA 125: CA 125: 4.1 U/mL (ref 0.0–30.2)

## 2010-04-16 LAB — BILIRUBIN, DIRECT: Bilirubin, Direct: 1.8 mg/dL — ABNORMAL HIGH (ref 0.0–0.3)

## 2010-04-16 LAB — URINE MICROSCOPIC-ADD ON

## 2010-04-16 LAB — URINE CULTURE

## 2010-04-16 LAB — APTT: aPTT: 44 seconds — ABNORMAL HIGH (ref 24–37)

## 2010-04-29 ENCOUNTER — Inpatient Hospital Stay (HOSPITAL_COMMUNITY)
Admission: EM | Admit: 2010-04-29 | Discharge: 2010-05-04 | DRG: 065 | Disposition: A | Discharge status: Skilled Nursing Facility | Payer: Medicare Other | Attending: Internal Medicine | Admitting: Internal Medicine

## 2010-04-29 ENCOUNTER — Inpatient Hospital Stay (HOSPITAL_COMMUNITY): Payer: Medicare Other

## 2010-04-29 ENCOUNTER — Emergency Department (HOSPITAL_COMMUNITY): Payer: Medicare Other

## 2010-04-29 DIAGNOSIS — I6330 Cerebral infarction due to thrombosis of unspecified cerebral artery: Principal | ICD-10-CM | POA: Diagnosis present

## 2010-04-29 DIAGNOSIS — I4891 Unspecified atrial fibrillation: Secondary | ICD-10-CM | POA: Diagnosis present

## 2010-04-29 DIAGNOSIS — D649 Anemia, unspecified: Secondary | ICD-10-CM | POA: Diagnosis present

## 2010-04-29 DIAGNOSIS — R1310 Dysphagia, unspecified: Secondary | ICD-10-CM | POA: Diagnosis present

## 2010-04-29 DIAGNOSIS — Z7982 Long term (current) use of aspirin: Secondary | ICD-10-CM

## 2010-04-29 DIAGNOSIS — S78119A Complete traumatic amputation at level between unspecified hip and knee, initial encounter: Secondary | ICD-10-CM

## 2010-04-29 DIAGNOSIS — R4789 Other speech disturbances: Secondary | ICD-10-CM | POA: Diagnosis present

## 2010-04-29 DIAGNOSIS — I129 Hypertensive chronic kidney disease with stage 1 through stage 4 chronic kidney disease, or unspecified chronic kidney disease: Secondary | ICD-10-CM | POA: Diagnosis present

## 2010-04-29 DIAGNOSIS — I5032 Chronic diastolic (congestive) heart failure: Secondary | ICD-10-CM | POA: Diagnosis present

## 2010-04-29 DIAGNOSIS — G8190 Hemiplegia, unspecified affecting unspecified side: Secondary | ICD-10-CM | POA: Diagnosis present

## 2010-04-29 DIAGNOSIS — N183 Chronic kidney disease, stage 3 unspecified: Secondary | ICD-10-CM | POA: Diagnosis present

## 2010-04-29 LAB — FOLATE: Folate: 10.2 ng/mL

## 2010-04-29 LAB — POCT I-STAT, CHEM 8
BUN: 19 mg/dL (ref 6–23)
HCT: 29 % — ABNORMAL LOW (ref 36.0–46.0)
Hemoglobin: 9.9 g/dL — ABNORMAL LOW (ref 12.0–15.0)
Sodium: 143 mEq/L (ref 135–145)
TCO2: 19 mmol/L (ref 0–100)

## 2010-04-29 LAB — DIFFERENTIAL
Basophils Absolute: 0 10*3/uL (ref 0.0–0.1)
Basophils Relative: 0 % (ref 0–1)
Monocytes Relative: 5 % (ref 3–12)
Neutro Abs: 9.1 10*3/uL — ABNORMAL HIGH (ref 1.7–7.7)
Neutrophils Relative %: 84 % — ABNORMAL HIGH (ref 43–77)

## 2010-04-29 LAB — CBC
Hemoglobin: 8.9 g/dL — ABNORMAL LOW (ref 12.0–15.0)
MCH: 22.8 pg — ABNORMAL LOW (ref 26.0–34.0)
MCHC: 30.5 g/dL (ref 30.0–36.0)
Platelets: 151 10*3/uL (ref 150–400)
RBC: 3.86 MIL/uL — ABNORMAL LOW (ref 3.87–5.11)
RDW: 18.3 % — ABNORMAL HIGH (ref 11.5–15.5)
WBC: 10.8 10*3/uL — ABNORMAL HIGH (ref 4.0–10.5)

## 2010-04-29 LAB — COMPREHENSIVE METABOLIC PANEL
AST: 14 U/L (ref 0–37)
BUN: 15 mg/dL (ref 6–23)
CO2: 20 mEq/L (ref 19–32)
CO2: 19 mEq/L (ref 19–32)
Calcium: 8.3 mg/dL — ABNORMAL LOW (ref 8.4–10.5)
Calcium: 8.5 mg/dL (ref 8.4–10.5)
Chloride: 111 mEq/L (ref 96–112)
Chloride: 114 mEq/L — ABNORMAL HIGH (ref 96–112)
Creatinine, Ser: 1.7 mg/dL — ABNORMAL HIGH (ref 0.4–1.2)
Creatinine, Ser: 1.62 mg/dL — ABNORMAL HIGH (ref 0.4–1.2)
GFR calc Af Amer: 37 mL/min — ABNORMAL LOW (ref >60)
GFR calc non Af Amer: 29 mL/min — ABNORMAL LOW (ref >60)
GFR calc non Af Amer: 31 mL/min — ABNORMAL LOW (ref >60)
Glucose, Bld: 117 mg/dL — ABNORMAL HIGH (ref 70–99)
Total Bilirubin: 0.9 mg/dL (ref 0.3–1.2)
Total Bilirubin: 0.7 mg/dL (ref 0.3–1.2)

## 2010-04-29 LAB — URINE MICROSCOPIC-ADD ON

## 2010-04-29 LAB — GLUCOSE, CAPILLARY
Glucose-Capillary: 117 mg/dL — ABNORMAL HIGH (ref 70–99)
Glucose-Capillary: 122 mg/dL — ABNORMAL HIGH (ref 70–99)

## 2010-04-29 LAB — CARDIAC PANEL(CRET KIN+CKTOT+MB+TROPI)
CK, MB: 2.3 ng/mL (ref 0.3–4.0)
CK, MB: 2.3 ng/mL (ref 0.3–4.0)
Relative Index: INVALID (ref 0.0–2.5)
Total CK: 59 U/L (ref 7–177)
Total CK: 61 U/L (ref 7–177)
Troponin I: 0.01 ng/mL (ref 0.00–0.06)

## 2010-04-29 LAB — HEMOGLOBIN A1C: Mean Plasma Glucose: 105 mg/dL (ref <117)

## 2010-04-29 LAB — BRAIN NATRIURETIC PEPTIDE: Pro B Natriuretic peptide (BNP): 209 pg/mL — ABNORMAL HIGH (ref 0.0–100.0)

## 2010-04-29 LAB — URINALYSIS, ROUTINE W REFLEX MICROSCOPIC
Bilirubin Urine: NEGATIVE
Nitrite: NEGATIVE
Specific Gravity, Urine: 1.018 (ref 1.005–1.030)
pH: 5.5 (ref 5.0–8.0)

## 2010-04-29 LAB — TROPONIN I: Troponin I: 0.02 ng/mL (ref 0.00–0.06)

## 2010-04-29 LAB — PROTIME-INR
INR: 1.15 (ref 0.00–1.49)
INR: 1.2 (ref 0.00–1.49)
Prothrombin Time: 14.9 seconds (ref 11.6–15.2)
Prothrombin Time: 15.4 seconds — ABNORMAL HIGH (ref 11.6–15.2)

## 2010-04-29 LAB — APTT: aPTT: 35 seconds (ref 24–37)

## 2010-04-29 LAB — LIPID PANEL
LDL Cholesterol: 71 mg/dL (ref 0–99)
Triglycerides: 87 mg/dL (ref <150)

## 2010-04-29 LAB — VITAMIN B12: Vitamin B-12: 275 pg/mL (ref 211–911)

## 2010-04-29 LAB — CK TOTAL AND CKMB (NOT AT ARMC): Relative Index: INVALID (ref 0.0–2.5)

## 2010-04-29 LAB — IRON AND TIBC

## 2010-04-29 NOTE — Consult Note (Addendum)
[name], [name]             ACCOUNT NO.:  [id number]  MEDICAL RECORD NO.:  [id number]           PATIENT TYPE:  E  LOCATION:  [facility]                         FACILITY:  [facility]  PHYSICIAN:  Mont Dutton, MD    DATE OF BIRTH:  March 12, 1927  DATE OF CONSULTATION:  04/29/2010 DATE OF DISCHARGE:                                CONSULTATION   REQUESTING PHYSICIAN:  Rebekah Hong, MD.  REASON FOR CONSULTATION:  Stroke.  HISTORY OF PRESENT ILLNESS:  [name] is an 77 year old African American female with a past medical history of diastolic heart failure, atrial fibrillation (not on Coumadin), chronic kidney disease, hypertension, left above-knee amputation, and history of pancreatitis who presents to the emergency department today with left-sided weakness, left side neglect, and difficulty talking.  She was last seen here at 5:00 p.m. today.  She arrived outside of the window for IV TPA and therefore was not administered.  She did not have a prior history of stroke.  Of note, she was recently admitted in January for Coumadin toxicity.  Apparently, her INR was elevated and her PCP instructed her to hold off on taking any further doses and to have her recheck of her INR, but she did not do this and continued taking her Coumadin.  She then presented to the emergency department with an INR of 5.2 and epistaxis.  On further discussion with her, it appeared she did not understand how to take Coumadin and the implications of not following strict instructions and therefore, the providers felt that it was not safe for her to take Coumadin anymore.  She was then changed to aspirin 325 mg daily.  It is not clear if she has been compliant with her medications.  REVIEW OF SYSTEMS:  Complete 14-point review of systems could not be completed secondary to the patient's difficulty speaking and family members not being present.  PAST MEDICAL HISTORY:  Diastolic heart failure, atrial  fibrillation, chronic kidney disease, hypertension, left above-the-knee amputation, pancreatitis.  HOME MEDICATIONS:  It is not known what exactly her home medications are.  Per the discharge summary in January 2012, she was on Lasix, multivitamin, potassium chloride, Xopenex, aspirin 325 mg daily, Coreg, iron, gabapentin, Benadryl, isosorbide dinitrate.  FAMILY HISTORY:  Noncontributory.  SOCIAL HISTORY:  She resides at a nursing home.  No smoking or illicit. She uses chewing tobacco.  ALLERGIES:  IVP DYE, IODINE, PENICILLIN.  PHYSICAL EXAMINATION:  VITAL SIGNS:  Temperature afebrile, blood pressure 133/100, pulse 84, respiration 16, pulse ox 98% on room air. GENERAL:  Supine, mildly agitated, chronically ill appearing. CARDIOVASCULAR:  No murmurs. RESPIRATORY:  Clear anteriorly. ABDOMEN:  Soft. EXTREMITIES:  Left above-knee amputation. MENTAL STATUS:  Alert and oriented to person and place.  She has moderate to severe dysarthria.  Naming and repetition are intact.  She is able to follow commands. CRANIAL NERVES:  Pupils are equal, round, and reactive to light.  Full visual field.  There is a right gaze preference and a left gaze palsy. Left facial droop is present.  No facial sensory deficits.  There is a left visual neglect present.  Tongue is midline.  MOTOR:  5/5 strength in the right upper and right lower extremity. Strength in the left upper extremity is very difficult to assess given the profound amount of left-sided neglect.  She does elevate her left upper extremity against gravity without drift.  Left hip flexion appears to be 5/5. SENSATION:  Left hemisensory loss. COORDINATION:  Unable to assess given the patient's difficulty following instructions for this command. DEEP TENDON REFLEXES:  1+/4 positive bilateral upper extremities, 1+/4 right knee jerk, and 0/4 right ankle jerk.  Right toe is mute.  LABORATORY DATA: 1. CBC is significant for a mildly elevated  white blood cell count of     10.8, anemia with hemoglobin of 8.9 and hematocrit of 28.7.     Platelet count is within normal limits at 167. 2. Cardiac enzymes were negative. 3. LFTs are within normal limits. 4. BMP reveals elevated creatinine of 1.9.  IMAGING:  Noncontrast head CT reveals no acute intracranial process. There is a stable appearance of a large left posterior fossa arachnoid cyst.  Mild cortical volume loss is noted.  ASSESSMENT AND PLAN:  [name] is an 77 year old female with history of diastolic heart failure, atrial fibrillation (not on Coumadin), chronic kidney disease, hypertension, and peripheral vascular disease who presents with symptoms of a right MCA territory stroke, most notably the right parietal region.  Her current symtpoms include moderate to severe dysarthria, right gaze preference, left gaze palsy, significant left-sided neglect, left hemisensory loss, and possibly some weakness in the left upper extremity.  Etiology of her stroke is likely related to atrial fibrillation for which she is not on Coumadin.  She has failed Coumadin therapy in the past given that she presented with elevated INR and significant epistaxis since she was not able to follow instructions for how to take her Coumadin.  RECOMMENDATIONS: 1. The patient should be n.p.o. and placed on IV fluids. 2. Bedside swallow evaluation. 3. Physical therapy, occupational therapy, speech therapy. 4. Repeat head CT tomorrow to evaluate distribution of stroke. 5. A 2-D echocardiogram. 6. Carotid Dopplers. 7. Permissive hypertension.  Hold blood pressure medications. Labetalol can be given as needed for blood  pressure greater than 200/100. 8. Check hemoglobin A1c and lipid panel in the morning. 9. DVT prophylaxis. 10. Until she is able to take p.o., give aspirin per rectum. 11. Frequent neuro checks.  Thank you very much for this consultation.  We will continue to follow.           ______________________________ Mont Dutton, MD     CS/MEDQ  D:  04/29/2010  T:  04/29/2010  Job:  [id number]  Electronically Signed by Mont Dutton MD on 04/29/2010 07:45:51 PM

## 2010-04-30 ENCOUNTER — Inpatient Hospital Stay (HOSPITAL_COMMUNITY): Payer: Medicare Other

## 2010-04-30 DIAGNOSIS — I319 Disease of pericardium, unspecified: Secondary | ICD-10-CM

## 2010-04-30 LAB — CBC
Hemoglobin: 9 g/dL — ABNORMAL LOW (ref 12.0–15.0)
MCHC: 31.1 g/dL (ref 30.0–36.0)
MCV: 76.1 fL — ABNORMAL LOW (ref 78.0–100.0)
RBC: 3.8 MIL/uL — ABNORMAL LOW (ref 3.87–5.11)
RDW: 18.5 % — ABNORMAL HIGH (ref 11.5–15.5)

## 2010-04-30 LAB — BASIC METABOLIC PANEL
BUN: 13 mg/dL (ref 6–23)
GFR calc non Af Amer: 30 mL/min — ABNORMAL LOW (ref >60)
Glucose, Bld: 106 mg/dL — ABNORMAL HIGH (ref 70–99)
Potassium: 4.1 mEq/L (ref 3.5–5.1)

## 2010-05-01 DIAGNOSIS — I6330 Cerebral infarction due to thrombosis of unspecified cerebral artery: Secondary | ICD-10-CM

## 2010-05-01 DIAGNOSIS — F4320 Adjustment disorder, unspecified: Secondary | ICD-10-CM

## 2010-05-01 LAB — CBC
HCT: 27.8 % — ABNORMAL LOW (ref 36.0–46.0)
MCH: 23.2 pg — ABNORMAL LOW (ref 26.0–34.0)
MCHC: 30.6 g/dL (ref 30.0–36.0)
MCV: 75.7 fL — ABNORMAL LOW (ref 78.0–100.0)
RDW: 18.4 % — ABNORMAL HIGH (ref 11.5–15.5)

## 2010-05-01 LAB — BASIC METABOLIC PANEL
BUN: 11 mg/dL (ref 6–23)
Calcium: 9 mg/dL (ref 8.4–10.5)
Creatinine, Ser: 1.68 mg/dL — ABNORMAL HIGH (ref 0.4–1.2)
GFR calc non Af Amer: 29 mL/min — ABNORMAL LOW (ref >60)
Glucose, Bld: 111 mg/dL — ABNORMAL HIGH (ref 70–99)
Sodium: 143 mEq/L (ref 135–145)

## 2010-05-02 ENCOUNTER — Inpatient Hospital Stay (HOSPITAL_COMMUNITY): Payer: Medicare Other

## 2010-05-02 LAB — CBC
MCHC: 30.3 g/dL (ref 30.0–36.0)
Platelets: 160 10*3/uL (ref 150–400)
RDW: 18.3 % — ABNORMAL HIGH (ref 11.5–15.5)

## 2010-05-02 LAB — BASIC METABOLIC PANEL
Calcium: 9.2 mg/dL (ref 8.4–10.5)
Creatinine, Ser: 1.68 mg/dL — ABNORMAL HIGH (ref 0.4–1.2)
GFR calc Af Amer: 35 mL/min — ABNORMAL LOW (ref >60)
GFR calc non Af Amer: 29 mL/min — ABNORMAL LOW (ref >60)
Sodium: 141 mEq/L (ref 135–145)

## 2010-05-02 NOTE — Consult Note (Signed)
  NAMEBALERIA, [name]             ACCOUNT NO.:  [id number]  MEDICAL RECORD NO.:  [id number]           PATIENT TYPE:  I  LOCATION:  3001                         FACILITY:  [facility]  PHYSICIAN:  Eulogio Ditch, MD DATE OF BIRTH:  03/01/1927  DATE OF CONSULTATION: DATE OF DISCHARGE:                                CONSULTATION   REASON FOR CONSULTATION:  Capacity for skilled nursing facility.  HISTORY OF PRESENT ILLNESS:  I saw the patient and reviewed the medical records.  The patient was seen with the case manager Delice Bison.  An 77 year old African American female, who was admitted because of the stroke. The patient has a left-sided weakness.  The patient was recommended to go to a skilled nursing facility.  The patient at this time was able to tell me that why she is in the hospital when I told her that because of her weakness the medical team wants to send her to skilled nursing facility.  Once she gets strong, she can go back to the home.  The patient agreed with that.  The patient fully understand at this time that it is better to go to the nursing home for a short period of time.  The patient was also seen by CIR and Dr. Riley Kill has reported that she is not a good fit for CIR.  I do not see any psychotic or manic or depressive symptoms at this time during my interview with the patient.  Again, the patient has ability to understand her medical issues, and she is agreeing to go to skilled nursing facility.  The son should be called and family meeting should be done for further discussion on this.  RECOMMENDATIONS:  I will say at this time the patient has capacity to make decision to go to a skilled nursing facility and the patient is agreeing for that.     Eulogio Ditch, MD     SA/MEDQ  D:  05/01/2010  T:  05/01/2010  Job:  [id number]  Electronically Signed by Eulogio Ditch  on 05/02/2010 05:07:32 PM

## 2010-05-05 NOTE — H&P (Signed)
[name], [name]             ACCOUNT NO.:  [id number]  MEDICAL RECORD NO.:  [id number]           PATIENT TYPE:  E  LOCATION:  [facility]                         FACILITY:  [facility]  PHYSICIAN:  Michiel Cowboy, MDDATE OF BIRTH:  [birth year]  DATE OF ADMISSION:  04/29/2010 DATE OF DISCHARGE:                             HISTORY & PHYSICAL   PRIMARY CARE PROVIDER:  [name], MD  CHIEF COMPLAINT:  The patient was found to have dense left hemiparesis.  The patient is an 77 year old female with history of diastolic heart failure and atrial fibrillation who was taken off her Coumadin per request of herself and the family as well as secondary to the risks of frequent falls and repeated bleeding complications, repeated epistaxis, severe anemia, and the patient's noncompliance with Coumadin.  She was found by her son today at around 12 a.m. slumped in her chair with a dense left hemiparesis.  EMS was called and she was brought into the emergency department.  At this point, the son has left.  The last time he has seen her was at 5 p.m. and she was well.  The patient at this point has hard time communicating and has slurred speech.  She is unable to provide me much of a history.  She can answer yes and no questions only.  She denies chest pain.  She does endorse some shortness of breath, although not sure if it is worse at baseline. She states something about missing her inhalers with no inhalers brought with her by EMS.  REVIEW OF SYSTEMS:  Otherwise unable to obtain.  PAST MEDICAL HISTORY:  Significant for: 1. Diastolic heart failure. 2. Recurrent epistaxis. 3. Anemia. 4. Atrial fibrillation, no longer on Coumadin. 5. Chronic kidney disease, stage III. 6. Hypertension. 7. History of above knee amputation secondary to osteomyelitis. 8. History of kidney stones, status post stenting. 9. History of pancreatitis secondary to gallstones. 10.History of diastolic heart  failure.  SOCIAL HISTORY:  The patient lives with her son.  She is wheelchair bound, widowed.  She has 9 children altogether.  She does not smoke or drink, never did smoke.  She does occasionally chew tobacco.  FAMILY HISTORY:  Noncontributory.  ALLERGIES: 1. IV DYE. 2. IODINE. 3. PENICILLIN.  MEDICATIONS: 1. She was supposed to take aspirin, although I am not sure she     actually has been taking it. 2. Potassium 10 mEq every other day. 3. Xopenex 2 puffs q.4 h. as needed. 4. Aspirin 325 mg a day. 5. Coreg 6.25 mg twice a day. 6. Neurontin 300 mg twice a day. 7. Amlodipine 5 mg daily. 8. Iron 324 mg twice a day.  These are all the medications that was brought with her.  PHYSICAL EXAMINATION:  VITAL SIGNS:  Temperature 98.9, blood pressure 150/73, pulse 74, respirations 22, and saturating 96% on room air. GENERAL:  The patient appears to be in no acute distress. HEENT:  Head nontraumatic.  Nasal passages and mouth, no blood. Apparently, she has had some nosebleeds since she arrived to the emergency department. LUNGS:  Occasional crackles bilaterally. HEART:  Irregularly irregular, but no murmurs could  be appreciated. ABDOMEN:  Slightly obese, but nontender. EXTREMITIES:  Lower extremities, left lower extremity surgically absent of the knee.  Right lower extremity having showing 2+ edema. NEUROLOGICAL:  The patient has heavy dense hemiparesis on the left and she is unable to move her left hand or arm and seems to ignore right side.  She also might have some right-sided facial droop as well and perhaps she seemed to be ignoring her left side as well. SKIN:  There is a skin tear on her right hand.  LABORATORY DATA:  White blood cell count 10.8, hemoglobin 9.9, sodium 143, potassium 3.5, creatinine 1.9, and albumin 3.3.  Cardiac enzymes unremarkable.  CT scan of head unremarkable.  EKG showing atrial fibrillation, but no ischemic changes.  Chest x-ray not  obtained.  ASSESSMENT/PLAN:  This is an 77 year old female with what likely seems to be right brain stroke with history of atrial fibrillation.  She was unable to take Coumadin because of noncompliance and also recurrent epistaxis.  She presents now with likely stroke. 1. Right brain cerebrovascular accident.  We will obtain 2-D echo,     Dopplers, MRI, MRA, neurology consult.  She was already on aspirin     and I am not sure again whether she was taking it or not and we     need to clarify this.  For now, we will continue aspirin as she     just recently had a nosebleed, they may consider either starting     her on Aggrenox or Plavix.  There could be a consideration of     restarting Coumadin, but in the past she had a very hard time     controlling her INR and had recurrent supratherapeutic INR and     severe epistaxis secondary to not being able to be compliant with     medications. 2. Hypertension.  She at this point probably will not be able to pass     a swallow eval.  We will start carvedilol.  We will give IV     metoprolol to avoid rebound tachycardia. 3. Shortness of breath.  She does appear to be somewhat crackly and     appeared to be fluid up.  We will obtain     chest x-ray and if needed give Lasix. 4. Prophylaxis.  Protonix, SCDs. 5. Code status.  Needs to be obtained once the patient is able to     provide it versus if family can be located.     Michiel Cowboy, MD     AVD/MEDQ  D:  04/29/2010  T:  04/29/2010  Job:  [id number]  cc:   [name]. Cliffton Asters, M.D.  Electronically Signed by Therisa Doyne MD on 05/05/2010 09:45:10 PM

## 2010-05-20 NOTE — Discharge Summary (Signed)
[name], [name]             ACCOUNT NO.:  [id number]  MEDICAL RECORD NO.:  [id number]           PATIENT TYPE:  I  LOCATION:  3001                         FACILITY:  [facility]  PHYSICIAN:  Osvaldo Shipper, MD     DATE OF BIRTH:  Feb 16, 1927  DATE OF ADMISSION:  04/29/2010 DATE OF DISCHARGE:                        DISCHARGE SUMMARY - REFERRING   PRIMARY CARE PHYSICIAN:  [name]. White, M.D.  CONSULTATION DURING THIS HOSPITALIZATION: 1. Neurology Service for stroke. 2. Dr. Rogers Blocker from Psychiatry for determination of capacity.  STUDIES DONE DURING THIS ADMISSION: 1. CT of the head on April 22; which showed no acute intracranial     process, stable arachnoid cyst was seen. 2. X-ray of the chest on April 22 showed mild bibasilar air space     opacities with vascular congestion and cardiomegaly. 3. MRI of the brain showed a large acute right MCA infarct with edema     and early mass effect, no midline shift or hemorrhage was noted. 4. MRA of head showed a motion degraded exam, the right MCA M1 segment     was patent, the right MCA middle and anterior division were     possibly occluded. 5. X-ray of the right hip was done, which showed right femoral head     osteonecrosis, degenerative changes of both hips were seen along     with the SI joints. 6. X-ray of the right shoulder showed stable advance glenohumeral     degenerative changes with evidence of chronic rotator cuff tear.     No acute findings were noted.  X-ray of the right elbow showed     degenerative changes without any acute process.  CT of the head     repeated this morning showed increased swelling throughout the     region of the acute infarct, though the swelling is not severe.     Right to left shift of only 2 mm, mild petechial blood products     within the region of the hemorrhage particularly in the frontal     region.  No frank hematoma. 7. Other studies done include an echocardiogram, which showed EF of  65-     70% with normal wall motion, mild MR, left atrium was mildly     dilated, small pericardial effusion was noted. 8. Carotid Dopplers were also poor studies, however, antegrade flow     was noted and no significant carotid artery stenosis was seen.     Cross transcranial Dopplers likely showed antegrade flow in the     right carotid siphon, right vertebral, and left ophthalmic     arteries.  PERTINENT LABORATORY DATA:  Hemoglobin between 8.5 and 9, MCV is 75, this is a close to her baseline, although the MCV seems to be slightly lower.  Creatinine also around 1.6, which is close to her baseline. Cardiac enzymes were negative.  BNP was 209, A1c was 5.3, LDL 71.  Iron was less than 10, ferritin 150.  No cultures were obtained.  DISCHARGE DIAGNOSES: 1. 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 2. History of diastolic heart  failure, currently compensated. 3. History of recurrent epistaxis. 4. History of atrial fibrillation, no longer on Coumadin. 5. History of chronic kidney disease, stage III. 6. History of hypertension.  BRIEF HOSPITAL COURSE:  Stroke.  This is an 77 year old Caucasian female who presented to the hospital with complaints of weakness in the left arm.  She was also found slumped over in her chair.  EMS was called, and the patient was brought into hospital.  She underwent imaging studies as discussed above and was found to have right MCA territory stroke, which was quite large.  Neurology was consulted.  They recommend changing the aspirin to Plavix.  The patient is no longer on Coumadin for AFib because of persistent epistaxis.  If the patient is deemed to require anticoagulation in the future, it should not be initiated for at least 3- 4 weeks.  The patient has been changed over to Plavix from aspirin.  She was seen by Physical Therapy, Occupational Therapy, and initially it was felt that the patient may benefit from inpatient  rehab, however, I believe that she is not really a candidate for the same.  Skilled nursing facility bed is being sought for her, and I am hopeful that she will have a bed available tomorrow.  Rest of her medical issues including diastolic heart failure, hypertension were all stable.  She did not have any epistaxis while she was hospitalized here.  The patient was followed by Neurology on a daily basis and they have signed off as of today.  Today, April 25, the patient is feeling well.  She is tolerating a diet so far.  Denies any new complaints.  She does have some pain in her neck area and the back.  She attributes this to muscular pain.  PHYSICAL EXAMINATION:  VITAL SIGNS:  Temperature is 97.5, heart rate 69, respiratory rate 24, blood pressure 158/72, saturation 96% on 2 L. GENERAL:  An elderly, overweight white female in no distress. HEENT:  Head is normocephalic, atraumatic.  Pupils are equal and reacting.  No pallor.  No icterus. LUNGS:  Clear to auscultation anteriorly bilaterally.  No wheezing, rales, or rhonchi. CARDIOVASCULAR:  S1, S2 is normal and regular.  No S3, S4, rubs, murmurs, or bruits. ABDOMEN:  Soft.  She does have dense hemiplegia on the left upper extremity.  She is status post left lower extremity amputation.  DISCHARGE MEDICATIONS: 1. Acetaminophen 650 mg every 4 hours as needed for pain. 2. Norvasc 10 mg p.o. daily. 3. Carvedilol 3.125 mg p.o. b.i.d. 4. Pepcid 20 mg p.o. daily. 5. Plavix 75 mg p.o. daily. 6. Crestor 10 mg p.o. daily. 7. Food thickener as needed. 8. Ferrous sulfate 325 mg daily. 9. Gabapentin 300 mg twice daily. 10.Multivitamins 1 tablet daily. 11.Potassium chloride 10 mEq every other day. 12.Xopenex inhaler 2 puffs every 4-6 hours as needed for wheezing.  FOLLOWUP:  With primary care physician in 3-4 weeks, with Dr. Pearlean Brownie in 2 months.  DIET:  Should be dysphagia I with nectar-thick liquids.  No straws.  The patient to be upright  when she is being fed.  PHYSICAL ACTIVITY:  Per rehab.  TOTAL TIME ON THIS DISCHARGE ENCOUNTER:  35 minutes.  Osvaldo Shipper, MD     GK/MEDQ  D:  05/02/2010  T:  05/02/2010  Job:  [id number]  cc:   [name]. Cliffton Asters, M.D. [facility]  Electronically Signed by Osvaldo Shipper MD on 05/20/2010 09:52:51 PM

## 2010-05-22 NOTE — Discharge Summary (Signed)
Rebekah, Henry             ACCOUNT NO.:  [id number]   MEDICAL RECORD NO.:  [id number]          PATIENT TYPE:  INP   LOCATION:  5156                         FACILITY:  [facility]   PHYSICIAN:  Hollice Espy, M.D.DATE OF BIRTH:  October 21, 1927   DATE OF ADMISSION:  06/23/2008  DATE OF DISCHARGE:  07/07/2008                               DISCHARGE SUMMARY   PRIMARY CARE PHYSICIAN:  [name], MD   CONSULTANTS:  1. De Blanch, MD, GYN/ONC Surgery.  2. Angelia Mould. Derrell Lolling, MD, [facility] Surgery.   DISCHARGE DIAGNOSES:  1. Gallstone pancreatitis.  2. Diarrhea, noninfectious.  3. Ovarian mass.  4. Hypokalemia, now resolved.  5. Hypertension.  6. Renal insufficiency secondary to dehydration, now resolved.  7. History of peripheral vascular disease.   HOSPITAL COURSE:  The patient is a 77 year old white female with past  medical history of hypertension and peripheral vascular disease who  presents with abdominal pain several days prior to coming into the  emergency room.  The patient was admitted and found to have lipase  greater than 2000, ultrasound showing stones and gallbladder sludge.  HIDA scan was then obtained, which did not show any visualization of the  gallbladder or small bowel.  Her total bilirubin was noted to be 4.7.  It was assumed the patient likely had a gallstone pancreatitis with  obstructive stone.  In the meantime, because of the patient's increase  in bilirubin, she was felt that she perhaps may need an ERCP.  Dr. Herbert Moors from [facility] was consulted, and the patient underwent an ERCP on  June 19.  She did well from this, no obvious filling defects were noted  in the biliary tree.  She was observed clinically.  Over the next  several days, the patient improved.  Her pancreatic enzymes had by June  20 trended down to normal.  Initially, the plan was for Dr. Derrell Lolling to  take the patient to surgery on June 22.  She started having  increased  pain as well as some diarrhea.  C. diff cultures were sent.  C. diff  cultures x3 were negative.  The patient was started on some Florastor,  which seemed to improve some of her symptoms.  She had been previously  put on IV antibiotics to help cool her gallbladder, and this was felt to  be more of a cause for this.  The patient's followup CT noted  significant edematous pancreatitis around the head, and the patient's  surgery was put on hold until this had improved.  In the meantime, she  was also noted to have an ovarian mass.  GYN/ONC was consulted, who felt  that in review of previous films back from July 2008 that the mass was  stable in size and could be monitored at this time.  Surgery then  planned to let her issues with the pancreatitis cool down and that she  could be followed up and operated on as an outpatient.  By June 26, the  patient was doing much better.  Her diet was slowly advanced.  She was  evaluated by  Physical Therapy, recommended home health PT/OT.  She  otherwise remained relatively stable.  The patient's lipase was  minimally elevated as of June 26 around 90.  At this point, she was  tolerating p.o.  By June 28, the patient was clinically better.  LFTs  were down.  Lipase was somewhat still elevated although did not  correspond with the patient's ability to take in p.o.  Diarrhea had  resolved as well.  She was remained stable, and the patient will follow  up in several weeks with Dr. Derrell Lolling of Surgery for interval lap  cholecystectomy.  In the meantime, GYN/ONC will work with Dr. Derrell Lolling by  possibly doing dual procedure if the ovarian mass needs to be resected.  By July 1, the patient was noted to be medically stable.  She will be  discharged to home in the care of her son.  Her activity will be slowly  increased.  Diet will be a low-fat diet, low sodium.   DISPOSITION:  Improved, and she was also to follow up with her PCP Dr.  Laurann Montana in about  1 month's time.   DISCHARGE MEDICATIONS:  New medicines:  1. Vicodin 5/500 one p.o. q.6 h. p.r.n.  2. Zofran 4 mg p.o. q.6 h. p.r.n. for nausea.  3. Questran 4 mg 3 times a day until she follows up with Surgery.   She will continue her previous medicines:  1. Triamterene/hydrochlorothiazide 75/50 p.o. daily.  2. Atenolol 100 p.o. daily.  3. Premarin 0.3 p.o. daily.      Hollice Espy, M.D.  Electronically Signed     SKK/MEDQ  D:  07/07/2008  T:  07/08/2008  Job:  [id number]   cc:   Angelia Mould. Derrell Lolling, M.D.  Graylin Shiver, M.D.  De Blanch, M.D.  [name] Cliffton Asters, M.D.

## 2010-05-22 NOTE — Assessment & Plan Note (Signed)
[name] is back regarding her left above knee amputation.  I last saw  her in June.  She is doing home therapies and walking with her  prosthesis.  She is still anxious about transferring with it.  Her son  helps her somewhat, but apparently they have had a bit of falling out as  off late as he has not been assistive.  Uses a wheelchair primarily for  mobility in the house.  She did pass a driving evaluation with her  prosthetic leg and she drives on the road.  She rates her pain as 7/10  and notes that it more generally in her right shoulder with some pain in  the right hand as well.  She feels that it is associated with her  wheelchair propulsion.  Sleep is fair-to-poor.  Her right knee is doing  very well with limited swelling and pain at this point.   REVIEW OF SYSTEMS:  Notable for the above.  Full review is written out  in the history section in the chart.   SOCIAL HISTORY:  Unchanged.   PHYSICAL EXAMINATION:  VITAL SIGNS:  Blood pressure is 155/70, pulse is  88, and respiratory rate 18.  She is sating 97% on room air.  GENERAL:  The patient is pleasant, alert, oriented x3.  She is sitting  in her wheelchair.  She remains overweight.  HEART:  Regular rate.  CHEST:  Clear.  ABDOMEN:  Soft, nontender.  EXTREMITIES:  The right knee is minimally tender with range of motion  today.  She had strength at 5/5 there.  She had an excellent left hip  movement today.  Left residual limb was nontender.  Right shoulder was  notable for impingement signs today, right more than left.  Cognitively,  she is intact.   ASSESSMENT:  1. Left above-knee amputation.  2. Peripheral vascular disease.  3. Carpal tunnel syndrome.  4. Osteoarthritis of the right knee.  5. Bilateral rotator cuff syndrome, right is more than left today.   PLAN:  1. After informed consent, we injected the right shoulder via lateral      approach with 40 mg Kenalog and 3 mL 1% lidocaine.  The patient      tolerated  well.  2. Use glucosamine p.r.n. for knee symptoms.  3. Encourage donning and doffing practice with her prosthesis at home      until she gets comfortable with using this on a more regular basis,      it will hard to put it on and to transfer.  Transferring will not      be as strong, however, with this.  4. We will have advanced home health, PT come out to assess her      wheelchair and make some upgrades.  She needed new arm rest at      least.      Ranelle Oyster, M.D.  Electronically Signed    ZTS/MedQ  D:  10/19/2007 11:55:53  T:  10/20/2007 01:55:13  Job #:  [id number]   cc:   [name]. Cliffton Asters, M.D.  Fax: [phone]

## 2010-05-22 NOTE — H&P (Signed)
[name], [name]             ACCOUNT NO.:  [id number]   MEDICAL RECORD NO.:  [id number]          PATIENT TYPE:  EMS   LOCATION:  ED                           [facility]ACILITY[facility]   PHYSICIAN:  Barnetta Chapel, MDDATE OF BIRTH:  06-May-1927   DATE OF ADMISSION:  07/25/2006  DATE OF DISCHARGE:                              HISTORY & PHYSICAL   PRIMARY CARE Tenleigh Byer:  [name], M.D.   CHIEF COMPLAINT:  Abdominal pain.   HISTORY OF PRESENTING COMPLAINT:  [name]  with multiple medical problems.  The patient is not a very good  historian.  The patient presented with abdominal and back pain.  Reviewing the patient's old records, she may have had chronic back pain,  as well as chronic pain syndrome.  She takes chondroitin for pain.  The  patient's abdominal pain is mainly at the left lower abdominal area.  She also has pain on the left side of the back, mainly at the lower  area, as well.  X-ray done in the ER has revealed L5-S1 degenerative  disc disease and spondylolisthesis.  The abdominal pain is associated  with nausea and vomiting.  According to the patient, she has vomited on  two occasions in the past.  She also has urinary frequency, but no  dysuria.  There is associated fever and chills.  No headache, no neck  pain, no shortness of breath and no significant weight-loss.  The  patient takes Premarin 0.3 mg p.o. once daily.  A CT scan of the abdomen  and pelvis, done in the ER, reveals possible cholelithiasis, hiatus  hernia, left renal calculus, a 7 cm cystic/solid ovarian mass (we will  await official report).  Urinalysis is also positive for nitrites.  White blood count is 11.2.   PAST MEDICAL HISTORY INCLUDES:  1. Hypertension.  2. GERD.  3. Irritable bowel syndrome.  4. Left above-the-knee amputation.  5. PVD.  6. Myopathy.  7. Carpal tunnel syndrome.  8. Osteoarthritis.  9. Status post hysterectomy.   MEDICATIONS PRIOR TO  ADMISSION INCLUDE:  1. Atenolol 100 mg p.o. once daily.  2. Methocarbamol 500 mg p.o. q.i.d.  3. Triamterene/HCTZ 75/50 one tab p.o. once daily.  4. Premarin 0.3 mg p.o. once daily.  5. Glucosamine chondroitin with Nexrutine triple-strength one tab p.o.      twice daily.   ALLERGIES:  Nil, as per the patient.  However, on reviewing the previous  documentation, there was mention of PENICILLIN allergy.   SOCIAL HISTORY:  The patient has been married twice.  She has nine  children from her first marriage.  She does not have any child with the  present husband to whom she has been married for about 40 years now.  She denied any use of cigarettes, alcohol or illicit substances.  She  lives with the husband and one son.   FAMILY HISTORY:  Not contributory.   REVIEW OF SYSTEMS:  Essentially as above.  The patient also reported  alternating diarrhea and constipation.   PHYSICAL EXAMINATION ON ADMISSION:  The temperature is 98, the blood  pressure ranges from 110-150/45-71 mmHg.  The heart rate is 73,  respiratory rate is 20 with O2 sat of 98%.  GENERAL CONDITION:  The patient is not in any urgent distress.  HEENT:  Reveals no pallor, no jaundice, a dry tongue.  NECK:  The neck is supple.  There is no elevated JVD or lymphadenopathy.  LUNGS:  Revealed decreased air entry globally.  CVS:  S1, S2.  ABDOMEN:  Obese, soft and tender in the left lower quadrant.  The  patient has previous operation scar.  Bowel sounds are present.  Organs  are difficult to assess.  NEUROLOGIC EXAM:  Nonfocal.  EXTREMITIES:  Reveal left above-the-knee amputation.  Right extremity  has no edema.   LABORATORIES DONE SO FAR:  Please note above, as well.  The white count  is 11.3, hemoglobin of 14.1, hematocrit of 41.9, MCV of 78.2, platelet  count of 226.  Urinalysis is positive for nitrite, moderate leukocyte  esterase, protein of 30 mg/dL.  Sodium is 135, potassium is 30.3,  chloride is 98, CO2 is 26, BUN is  89, creatinine of 1.27 and blood sugar  of 135.  Albumin is 3.3, total protein is 7.6.  Lipase is 37.   Abdominal x-ray with chest has revealed no acute cardiopulmonary  findings, paucity of bowel gas, no definite findings for obstruction and  no free air.  Unofficial report of the CT scan of the abdomen and pelvis  is essentially as above.   IMPRESSION:  1. Abdominal pain.  2. Urinary tract infection.  3. Back pain.  4. Dehydration.  5. Hypokalemia.  6. Cholelithiasis.  7. Left nephrolithiasis.  8. Possible ovarian mass (we will await official CT report of the      abdomen and pelvis).  9. Hypokalemia.  10.Dehydration.   PLAN:  We will admit patient to the regular medical floor.  We will  rehydrate the patient.  We will check the patient's urine and blood for  culture and sensitivity.  We will start patient on IV ceftriaxone.  We  will control the  patient's blood pressure and pain.  We will follow the official report  of the CT scan of the abdomen and pelvis.  We will most likely get  gynecology consult to further evaluate the ovarian mass.  Further  management will depend on hospital course.      Barnetta Chapel, MD  Electronically Signed     SIO/MEDQ  D:  07/25/2006  T:  07/25/2006  Job:  [id number]   cc:   [name]. Cliffton Asters, M.D.  Fax: [phone]

## 2010-05-22 NOTE — Consult Note (Signed)
NAMEJOVIE, [name] NO.:  [id number]   MEDICAL RECORD NO.:  [id number]          PATIENT TYPE:  INP   LOCATION:  5151                         FACILITY:  [facility]   PHYSICIAN:  Graylin Shiver, M.D.   DATE OF BIRTH:  January 05, 1928   DATE OF CONSULTATION:  06/25/2008  DATE OF DISCHARGE:                                 CONSULTATION   REASON FOR CONSULTATION:  [name] who was  admitted to the hospital because of a 2-day history of abdominal pain.  She came to the emergency room.  An abdominal ultrasound showed  gallstones and sludge.  Her amylase and lipase on June 24, 2008, were  1315 and 1644 respectively.  Today, her amylase and lipase are 178 and  519 respectively.  Her total bilirubin yesterday was 4.7 whereas today  it was 6.2.  The alkaline phosphatase was 82 yesterday and 64 today.  The transaminases yesterday were AST 53, ALT 23, and today they are 31  and 15   Her white blood cell count today was 19,200, which is higher than it was  a couple of days ago when she came in when it was 9.4.  This increase in  WBCs occurred despite being on antibiotics.  A HIDA scan was done and it  did not show any evidence of gallbladder or common bile duct filling.  Surgery was called in consultation and then GI was called.  We were  called because they were worried about the possibility of a common bile  duct obstruction from a possible stone since the biliary tree did not  show up on the HIDA scan.  They were also concerned about the  possibility of ascending cholangitis because of the elevation of the  white count despite being on antibiotics.   PAST HISTORY:   ALLERGIES:  PENICILLIN.   MEDICAL PROBLEMS:  Hypertension, peripheral vascular disease, cataract  surgeries, left-sided above-knee amputation, bilateral tubal ligation,  cataract surgery.   SOCIAL HISTORY:  The patient does not smoke or drink alcohol.   MEDICATIONS:  Atenolol, Maxzide,  Premarin.   PHYSICAL EXAMINATION:  VITAL SIGNS:  Temp 97.6, pulse 62, blood pressure  117/62.  GENERAL:  She is confused.  She is in no major distress.  HEART:  Regular rhythm.  No murmurs.  LUNGS:  Clear.  ABDOMEN:  Bowel sounds were present.  The abdomen was soft, but there  was tenderness in the right upper quadrant.   IMPRESSION:  1. Gallstone pancreatitis.  2. Possible ascending cholangitis.   PLAN:  I have spoken to Surgery and we will go ahead with doing an ERCP  to determine if she has a common duct stone causing an obstruction and  also to see whether she has purulent material coming from the biliary  tree, which would suggest an ascending cholangitis.  We will continue  her on antibiotics.  I spoke to her son, Casimiro Needle and I  explained the ERCP with possible sphincterotomy.  I explained the risks  of bleeding, infection, perforation, and pancreatitis.  He understands  and consents for his mother to  have this procedure.  We could not get  consent from the patient because of confusion.           ______________________________  Graylin Shiver, M.D.     SFG/MEDQ  D:  06/25/2008  T:  06/26/2008  Job:  [id number]   cc:   [facility]

## 2010-05-22 NOTE — Op Note (Signed)
[name], [name]             ACCOUNT NO.:  [id number]   MEDICAL RECORD NO.:  [id number]          PATIENT TYPE:  INP   LOCATION:  5006                         FACILITY:  [facility]   PHYSICIAN:  Anselm Pancoast. Weatherly, M.D.DATE OF BIRTH:  [birth year]   DATE OF PROCEDURE:  07/20/2008  DATE OF DISCHARGE:                               OPERATIVE REPORT   PREOPERATIVE DIAGNOSIS:  Subacute cholecystitis with stones.   POSTOPERATIVE DIAGNOSIS:  Subacute cholecystitis with stones.   OPERATIONS:  Laparoscopic cholecystectomy with general anesthesia.   SURGEON:  Anselm Pancoast. Zachery Dakins, MD   ASSISTANT:  Lorne Skeens. Hoxworth, MD   ANESTHESIA:  General.   HISTORY:  [name] is a 77 year old female with history of  peripheral vascular disease who was admitted on June 24, 2008 by the  hospitalist Dr. Lendell Caprice and a surgical consultation was requested and  Dr. Ezzard Standing saw her.  At that time, she was recovering from pancreatitis,  had stones in her gallbladder, had a bilirubin of 4.7 and a surgical  consultation was requested.  Dr. Ezzard Standing was up the opinion that she  probably needed an ERCP and this was done by Dr. Evette Cristal on June 25, 2008  and had stones in her gallbladder and postoperatively they elected to  try to let her get clinically improved before proceeding with a  cholecystectomy.  She was discharged on July 07, 2008 has had persistent  intermittent nausea and vomiting and was readmitted by the [facility] yesterday and a repeat surgical consultation was  requested.  She was definitely tender in the right upper quadrant.  They  did a CT which showed a very distended and thickened gallbladder and her  liver function studies were normal, BUN was 7 and I recommended we  proceed on with a laparoscopic cholecystectomy and cholangiogram.  She  is on Cipro 400 mg IV q.12 hours and received the dose preoperatively.  She was positioned on the OR table, induction of general  anesthesia,  endotracheal tube, oral tube into the stomach.  She has had a previous  lower midline incision from some type of GYN procedure and I made the  little incision above the umbilicus.  The fascia was identified  approximately 3 cm down in the adipose tissue, then a small vertical  incision made.  The fascia picked up and then these adhesions here from  the previous lower GYN surgery.  I could identify the peritoneal cavity.  The actual traction sutures were placed superiorly and inferiorly and  then the Hasson cannula introduced.  You could see the upper abdomen, a  very thickened gallbladder with the omentum acutely inflamed and I was  able to place the trocar upper at the subxiphoid area through the  falciform and two lateral 5 mm trocars were placed.  We then placed the  camera in the upper trocar and freed up some of the adhesions around the  where the trocar came in for a better visualization without looking  through the adhesions from the lower midline incision.  First trying to  grasp the gallbladder you could not, it was  so tense and a Heritage manager was used to decompress the gallbladder.  After it was  decompressed, I then grabbed it with a grasper retracted upward and  outward.  Fortunately, the cystic duct was identified fairly easily and  we encompassed it trying to put a clip on the cystic duct.  The cystic  duct is a very poor turgor and the clip cut the cystic duct.  Since she  has had an ERCP with a stone clear at the bile duct and the cystic duct  is not all that large we elected to put two Endoloops around the  proximal cystic duct and these were the Vicryl and then could identify  the cystic artery that was doubly clipped proximally and then cauterized  distally and then freed the gallbladder up from its bed.  There was a  little bit of bile spillage and after the gallbladder was completely  freed it was placed in an EndoCatch bag.  She has gravel and  also very  large stones in the gallbladder and we looked at the gallbladder bed and  any little area that was bleeding was coagulated.  The Endoloops looked  like they got a good lasso on the cystic duct.  No evidence of any  significant bleeding and we then placed a Blake drain through the  lateral 5 mm port placed about a third of the piece of Surgicel in the  proximal portion of the gallbladder fossa and then switched the camera  to the upper 10 mm port and grabbed the bag containing the gallbladder.  I opened the fascia up just a little more so we could get it out through  the fascia and then closed the fascia with interrupted sutures of 0  Prolene.  The patient tolerated the procedure nicely.  I put a Vicryl in  the fascia of the subxiphoid area.  The drain had been sutured to the  skin.  The subcutaneous wounds were closed with 4-0 Vicryl, benzoin and  Steri-Strips on the skin.  We will keep her on the antibiotics.  I think  she will be feeling better over the next day or two and probably get the  [place] drain out before she is actually discharged.      Anselm Pancoast. Zachery Dakins, M.D.  Electronically Signed     Anselm Pancoast. Zachery Dakins, M.D.  Electronically Signed    WJW/MEDQ  D:  07/20/2008  T:  07/21/2008  Job:  [id number]   cc:   Graylin Shiver, M.D.

## 2010-05-22 NOTE — Assessment & Plan Note (Signed)
HISTORY OF PRESENT ILLNESS:  The patient is back regarding a left above-  knee amputation.  She has had a long year hospitalized for gallbladder  issues in the winter and again in the summer for cholecystectomy.  Her  left above-knee prosthesis no longer fits as the socket is too is big.  She has had some right knee and left shoulder pain, otherwise it is  under fair control generally.  The right knee just began to bother her a  few days ago with some of the damp weather we had.  She rates her pain  2/10 today.  It may range from 2-10/10.  Pain is sharp, stabbing,  aching.  Pain interferes with general activity, relations with others,  enjoyment of life on a mild-to-moderate level.   REVIEW OF SYSTEMS:  Notable for occasional diarrhea.  She has numbness  in her hands.  She has had some weight loss, night sweats, problems with  appetite at times due to the medical issues above.  Full 14-point review  is in the written health and history section of the chart.   SOCIAL HISTORY:  The patient is widowed, living with her son, who is  with her today.   PHYSICAL EXAMINATION:  Blood pressure 174/86, pulse is 64, respiratory  rate 18.  She is sating 96% on room air.  The patient is pleasant,  alert, and oriented x3.  She had some mild pain in the left shoulder  with rotator cuff impingement maneuvers today.  Right knee is notable  for some mild crepitus with extension and flexion and pain with meniscal  provocation.  No instability was seen there.  She has mild valgus  deformity at the right knee.  Strength otherwise is 5/5 with decreased  sensory function in both hands in a stocking-glove distribution.   ASSESSMENT:  1. Left above-knee amputation.  2. History of peripheral vascular disease and associated neuropathy.  3. Carpal tunnel syndrome.  4. Osteoarthritis, right knee.  5. Bilateral rotator cuff syndrome.   PLAN:  1. Recommended conservative care for her shoulder and knee at this    point.  She also wants to stay conservative.  2. We will send the patient to advance the prosthetics for a socket      revision or replacement.  She may benefit from a followup course of      outpatient PT to practice with her prosthesis.  She has not used it      for some time.  3. I will see her back in about 4 months if not sooner if needed.      Ranelle Oyster, M.D.  Electronically Signed     ZTS/MedQ  D:  08/29/2008 13:19:04  T:  08/30/2008 06:22:50  Job #:  [id number]

## 2010-05-22 NOTE — Assessment & Plan Note (Signed)
[name] is back regarding left above-knee amputation and right knee pain.  She has done well with the Celebrex and the glucosamine.  She did change  her glucosamine capsules recently, and she notes more frequent stooling.  She also complains of some blurred vision of late.  She has seen the eye  doctor who told her nothing was wrong with her eyes.  She rates her pain  5-10/10 depending on activity levels.  She is only using her wheelchair  primarily.  She can get into the car independently but is unable to move  the chair into the car once there.  She depends on her son somewhat for  transportation.   REVIEW OF SYSTEMS:  The patient reports some occasional itching.  Other  pertinent positives are above.  Full review is in written health history  section and chart.   SOCIAL HISTORY:  The patient is widowed and living with her son, who  remains supportive.   PHYSICAL EXAMINATION:  Blood pressure is 177/87, pulse 84, respiratory  rate 24, and she is saturating 97% on room air.  The patient is  pleasant, alert, and oriented x3.  Edema on both legs are improved  especially the amputated side.  She had good range in the right knee  with better assisted movement and good passive movement today.  Strength  is generally 4+/5 both legs and similar in the upper extremities with  some pain in both shoulders with impingement maneuvers today.  Cognitively, she is intact.  HEART:  Regular.  CHEST:  Clear.  ABDOMEN:  Soft and nontender.  She remains overweight.   ASSESSMENT:  1. Left above-knee amputation.  2. Peripheral vascular disease with peripheral neuropathy.  3. Carpal tunnel syndrome.  4. Osteoarthritis to the right knee.  5. Left rotator cuff syndrome.   PLAN:  1. I asked the patient to hold off on Celebrex for now due to visual      symptoms.  She is seeing her family physician next week, so she      will follow up with her as well for further workup.  She may need      better blood  pressure control in the context of the visual changes.  2. We will send her to outpatient PT for gait training with      prosthesis.  3. The patient wants to change back to her glucosamine capsules due to      the bowel changes she experience with new supplements.  4. I will see her back in about 6 months' time.      [name], M.D.  Electronically Signed     ZTS/MedQ  D:  06/19/2007 13:25:05  T:  06/20/2007 08:07:37  Job #:  [id number]   cc:   [name]. [name], M.D.  Fax: [phone]

## 2010-05-22 NOTE — Assessment & Plan Note (Signed)
[name] is back regarding her left above-knee amputation and knee pain on  the right.  She did very well with her right knee injection last visit  and has no pain today.  She has been doing occasional walking with the  left above-knee prosthesis, but not as much due to the cooler weather.  She complains of some numbness and pain in her hands, particularly along  the thumb and 1st 2 fingers.  She has complained of some abdominal  cramping as well.  Today her pain is a 0/10.  She states her sleep can  be poor at times due to hand pain.  She uses Celebrex 200 mg daily.   REVIEW OF SYSTEMS:  Notable for spasms, tingling, weight gain, skin  rash, abdominal pain, history of sleep apnea.  Other pertinent positives  are listed above.   SOCIAL HISTORY:  The patient is a widow, living with her son.   PHYSICAL EXAMINATION:  VITAL SIGNS:  Blood pressure is 170/67, pulse is  80, respiratory rate 18.  She is sating 97% on room air.  GENERAL:  Patient is pleasant, alert and oriented x3.  EXTREMITIES:  Left leg edema is much improved.  She is wearing her stump  shrinker today.  She has good range of motion of the left hip.  Right  knee was better with active and passive flexion-extension as well as  meniscal maneuvers today.  Strength is 4-4+/5 bilateral lower  extremities.  Patient had positive Tinel's signs in both wrists.  Her  strength is actually good with some decrease in sensation along the  median nerve distribution.  HEART:  Regular.  CHEST:  Clear.  ABDOMEN:  Generally soft and nontender.  She remains overweight.   ASSESSMENT:  1. Left above-knee amputation.  2. Peripheral vascular disease with peripheral neuropathy.  3. Carpal tunnel syndrome.  4. Osteoarthritis of the right knee.  5. Left rotator cuff syndrome.   PLAN:  1. Obviously some of her carpal tunnel symptoms have been a bit worse      since she has been using the wheelchair more.  I urged her to try      her neutral wrist  splints for the carpal tunnel symptoms.  May      consider steroid injections along the way as well.  This should be      more reason for her to begin walking on her left above-knee      prosthesis more to keep from overusing the hands in wheelchair      propulsion.  2. Consider resuming PT for better balance and prosthetic training.  3. Continue Celebrex for now 200 mg daily.  Will watch closely if her      blood pressure increases on a more regular basis.  4. I advised patient to follow up with her PCP regarding her abdominal      spasms and questionable mass that was seen last summer.  I really      knew nothing about this until today.  5. I will see her back in 3-4 months' time.      Ranelle Oyster, M.D.  Electronically Signed     ZTS/MedQ  D:  03/27/2007 13:37:15  T:  03/27/2007 14:04:29  Job #:  [id number]

## 2010-05-22 NOTE — Assessment & Plan Note (Signed)
FOLLOWUP VISIT   The patient is back regarding her left above knee amputation.  She has  been wearing her stump shrinker more, however, her son states that she  hardly gets out of the wheelchair now.  She has a lot of right knee pain  over the last few weeks particularly that is impairing her transferring.  The patient also complains of pain in both of her hands at the distal  joints.  She complains that her pain is sharp and burning in its  quality.  The pain interferes with her general activity, relations with  others and enjoyment of life on a moderate level.  Sleep is poor.  The  patient does have a ramp now getting into the house.   REVIEW OF SYSTEMS:  Notable for weight gain, night sweats, spasms,  trouble walking.  Other presents problems listed above and full review  in written health and history section.   SOCIAL HISTORY:  The patient is married and living with her husband and  her son.   PHYSICAL EXAMINATION:  Blood pressure 127/79, pulse 75, respiratory rate  18, she is saturating 98% on room air.  The patient is pleasant.  Alert  and oriented times 3.  Left leg edema is improved.  She had actually  good range of motion in the left hip and was able to extend the leg  fully while in the supine position with the right leg elevated.  She had  pain with any range of motion of the right knee, particularly flexion  today.  She had some pain in the infrapatellar region and some  infrapatellar swelling was noted.  Strength was 4+-5/5 in the left hip.  She had 3+-4/5 strength in the right hip and knee with pain inhibition  noted.  She had a small callous over the right first toe.  Hands are  notable for pain along the PIP's and DIP's.  HEART:  Regular.  CHEST:  Clear.  ABDOMEN:  Soft, nontender.   ASSESSMENT:  1. Left below knee amputation.  2. Peripheral vascular disease with peripheral neuropathy.  3. History of carpal tunnel syndrome.  4. Osteoarthritis of the knees and  multiple joints.  5. Left rotator cuff syndrome.   PLAN:  1. Continue stump shrinker.  2. After informed consent we injected the right knee via the anterior      approach using 40 mg of Kenalog and 3 mL of 1% Lidocaine.  The      patient tolerated well.  3. Celebrex 200 mg q. day.  Watch it closely for swelling and GI side      effects, as well as blood pressure changes.  4. Consider home health physical therapy or outpatient physical      therapy as she begins to get a bit more mobile.  5. I will see her back in 3 months.      Ranelle Oyster, M.D.  Electronically Signed     ZTS/MedQ  D:  12/08/2006 11:38:59  T:  12/08/2006 13:36:16  Job #:  [id number]

## 2010-05-22 NOTE — Consult Note (Signed)
NAMENASHONDA, LIMBERG             ACCOUNT NO.:  [id number]   MEDICAL RECORD NO.:  [id number]          PATIENT TYPE:  OUT   LOCATION:  GYN                          FACILITY[facility]  [facility]   PHYSICIAN:  De Blanch, M.D.DATE OF BIRTH:  1927/05/29   DATE OF CONSULTATION:  DATE OF DISCHARGE:                                 CONSULTATION   The patient is scheduled to be seen today for evaluation of a complex  pelvic mass measuring 8 x 6 x 6.6 cm.  Her records are reviewed and it  is noted that the radiologists feel this is worrisome for an ovarian  neoplasm.  When explained to the patient, she refused to be seen and,  specifically, refused to have a pelvic exam.  We did note that surgery  would be usually recommended in these circumstances, and she indicated  she would refuse to have any surgery and left the clinic.  It is noted  that her CA125 value is 12 and CEA less than 0.5.   She will return to her primary physicians for continuing followup.  We  would be happy to see her in the future if she changed her mind and  wished to be seen.      De Blanch, M.D.  Electronically Signed     DC/MEDQ  D:  08/12/2006  T:  08/13/2006  Job:  [id number]   cc:   [name]. Cliffton Asters, M.D.  Fax: [phone]   Barnetta Chapel, MD

## 2010-05-22 NOTE — Assessment & Plan Note (Signed)
[name] is back regarding her left above-knee amputation.  She complains  of some burning and numbness in the hands which has been intermittent  over the last few months.  Her right shoulder in general is doing much  better.  She denies right knee pain.  She is not walking much in the  house.  Over the last few months, however, she attributes this to the  cold weather.  She does use her wheelchair in the home predominantly.  She does walk short distances occasionally in home.   REVIEW OF SYSTEMS:  Notable for weight loss by desire.  She reports some  coughing.  Other pertinent positives are above and full review is in the  written health and history section of the chart.   SOCIAL HISTORY:  The patient is widowed and lives with her son.   PHYSICAL EXAMINATION:  VITAL SIGNS:  Blood pressure is 146/67, pulse 69,  respiratory 18, and she is sating 100% on room air.  GENERAL:  The patient is pleasant, alert, and oriented x3.  She has lost  some weight over the last few months.  Left leg is clean and intact.  Her right knee was minimally tender with passive and active movement  today.  She had fairly good rotator cuff movements in the right  shoulder.  Cognitively, she is intact.  Her strength 5/5 with decreased  sensory function displayed in both hands in the stocking-glove  distribution.  Tinel's sign is positive at the wrist, but not consistent  with her usual symptoms with complaints today.   ASSESSMENT:  1. Left above-knee amputation.  2. Peripheral vascular disease.  3. Carpal tunnel syndrome.  4. Osteoarthritis, right knee.  5. Bilateral rotator cuff syndrome, right greater than the left.  6. Peripheral neuropathy associated with peripheral vascular disease      and overriding carpal tunnel syndrome as well.   PLAN:  1. No interventions were performed today.  Recommended stretching and      range of motion.  I wanted to work on ambulation as we talked      before.  She states  that she wants to walk more with leg, but she      needs to become more consistent in effort.  Longer she waits the      harder it will be.  2. I wrote a prescription for replacement armrest for her wheelchair,      which she can pick up in [facility].  3. I will see her back in about 6 months' time.      Ranelle Oyster, M.D.  Electronically Signed     ZTS/MedQ  D:  01/12/2008 12:28:29  T:  01/13/2008 03:21:59  Job #:  [id number]   cc:   [name]. Cliffton Asters, M.D.  Fax: [phone]

## 2010-05-22 NOTE — H&P (Signed)
[name], [name]             ACCOUNT NO.:  [id number]   MEDICAL RECORD NO.:  [id number]          PATIENT TYPE:  INP   LOCATION:  5006                         FACILITY:  [facility]   PHYSICIAN:  Corinna L. Lendell Caprice, MDDATE OF BIRTH:  1927-10-28   DATE OF ADMISSION:  07/15/2008  DATE OF DISCHARGE:                              HISTORY & PHYSICAL   CHIEF COMPLAINT:  Weakness, diarrhea, and nausea.   HISTORY OF PRESENT ILLNESS:  [name] is a 77 year old female, who  was recently discharged with a diagnosis of biliary pancreatitis.  She  had been seen by GI and Surgery, and had an ERCP.  She had an abnormal  HIDA scan and on ERCP, there was no obvious filling defects.  She was  treated with IV antibiotics and bowel rest.  She was scheduled to have  an elective cholecystectomy as an outpatient.  Apparently, she had  diarrhea during the hospitalization and was C. diff negative.  Since  then, she reports that she has become sick again.  She has had about 3  loose stools a day over the past few days.  She has been nauseated and  unable to eat or drink much.  She feels very weak.  She describes some  lower abdominal pain.  She denies any dysuria.  She denies fevers or  chills.  She denies melena, hematochezia, or hematemesis.  Currently,  she is feeling better after IV fluids and pain medication and nausea  medication.   PAST MEDICAL HISTORY:  1. Peripheral vascular disease, status post left AKA.  2. Hypertension.  3. 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   MEDICATIONS:  1. Vicodin as needed.  2. Zofran as needed.  3. Questran 4 times a day.  4. Triamterene and hydrochlorothiazide 75/50 mg a day.  5. Atenolol 100 mg a day.  6. Premarin 0.3 mg a day.   SOCIAL HISTORY:  Reviewed and as per previous H and P.   FAMILY HISTORY:  Reviewed and as per previous.   REVIEW OF SYSTEMS:  As above, otherwise negative.   PHYSICAL EXAMINATION:  VITAL SIGNS:  Temperature is 97.6, blood pressure  126/70, pulse 88, respiratory rate 15, and oxygen saturation 99% on room  air.  GENERAL:  The patient is an elderly female, in no acute distress.  HEENT:  No scleral icterus.  Pupils equal, round, and reactive to light.  Dry mucous membranes.  NECK:  Supple.  No lymphadenopathy.  LUNGS:  Clear to auscultation bilaterally without wheezes, rhonchi, or  rales.  CARDIOVASCULAR:  Regular rate and rhythm without murmurs, gallops, or  rubs.  ABDOMEN:  Normal bowel sounds, soft, nontender, and nondistended.  GU AND RECTAL:  Deferred.  EXTREMITIES:  Status post left AKA.  Right leg is without any edema.  NEUROLOGIC:  Alert and oriented.  Cranial nerves and sensorimotor exam  are grossly intact.  PSYCHIATRIC:  Calm and cooperative with normal affect.   LABORATORIES:  CBC is remarkable for hemoglobin of 11.6 and hematocrit  34.  Complete metabolic panel significant for creatinine of 2.36, total  protein of 8.6, and lipase 81.  Urinalysis shows small bilirubin,  negative ketones, specific gravity 1.015, pH 5.5, negative blood,  negative protein, negative nitrite, moderate leukocyte esterase, and 3-6  white cells..   ASSESSMENT AND PLAN:  1. Nausea, vomiting, diarrhea:  Certainly, I am concerned about      recurrence of her pancreatitis.  She will get supportive care and      IV fluids.  Once her creatinine improves, get a CT of the pancreas      if clinically indicated.  For now, try clear liquid diet and      antiemetics.  2. Recent admission for biliary pancreatitis.  3. Dehydration.  4. Acute renal failure secondary to dehydration.  5. Urinary tract infection, mild:  She does describe some lower      abdominal pain and this could be contributing to some of her      symptoms.  I will get a urine culture and cover with Rocephin for      now.  Watch for worsening diarrhea.  6. Left above-knee amputation.  7. Peripheral vascular disease.  8. Ovarian cyst.  The patient saw a GYN/ONC during  the last      hospitalization and is to follow up as an outpatient.      Corinna L. Lendell Caprice, MD  Electronically Signed     CLS/MEDQ  D:  07/16/2008  T:  07/16/2008  Job:  [id number]   cc:   [name]. Cliffton Asters, M.D.  Sandria Bales. Ezzard Standing, M.D.  Graylin Shiver, M.D.

## 2010-05-22 NOTE — Discharge Summary (Signed)
NAMEBETTI, [name]             ACCOUNT NO.:  [id number]   MEDICAL RECORD NO.:  [id number]          PATIENT TYPE:  INP   LOCATION:  1526                         FACILITY:  [facility]   PHYSICIAN:  Barnetta Chapel, MDDATE OF BIRTH:  02-22-1927   DATE OF PROCEDURE:  DATE OF DISCHARGE:  07/28/2006                    STAT - MUST CHANGE TO CORRECT WORK TYPE   PRIMARY CARE Tavia Stave:  [name], M.D.   DISCHARGE DIAGNOSES:  1. Urinary tract infection secondary to Klebsiella pneumoniae.  2. Complex left ovarian mass.  3. Dehydration.  4. Hypertension.  5. Abdominal pain.  6. Back pain.   DISCHARGE MEDICATIONS:  1. Norvasc 5 mg p.o. once daily.  2. Atenolol 100 mg p.o. once daily.  3. Methocarbamol 500 mg p.o. q.i.d. p.r.n.  4. Glucosamine chondroitin with Nexrutine (triple strength) 1 to be      taken twice daily.   CONSULTATIONS DONE:  Gynecologic consultation done by Dr. Regis Bill.  Dr.  Regis Bill has advised a GYN oncologist referral.  An appointment has been  made for the patient to see the GYN oncologist, Dr. Katheren ShamsSharol Given, on August 12, 2006 at 4 p.m.   IMAGING STUDIES DONE:  1. CT scan of the abdomen and pelvis revealed a 7.5 x 5.3 x 7 cm      complex solid and cystic lesion associated with the left ovary.      This is worrisome for possible ovarian neoplasm.  2. Pelvic ultrasound, which revealed 8 x 6 x 6.6 cm complex left      adnexal cyst, worrisome for ovarian neoplasm.   PERTINENT LABORATORY WORK DONE:  The urine C&S  grew a Klebsiella  pneumoniae that is sensitive to ceftriaxone.   BRIEF HISTORY AND HOSPITAL COURSE:  Please refer to the H&P done on July 25, 2006.  [name] with multiple medical  problems.  The patient presented with abdominal and back pain.  Workup  done during admission revealed possible UTI.  The CT scan of the abdomen  and pelvis done on admission revealed left ovarian mass.   The patient was admitted  to the regular medical floor.  Urine was sent  for culture and sensitivity.  The urine culture eventually grew  Klebsiella pneumoniae.  The patient was adequately rehydrated.  She was  treated with a 4-day course of IV ceftriaxone 1 g once daily.  A  gynecology consult was made for the left ovarian mass.  The patient was  seen by Dr. Regis Bill.  Dr. Regis Bill recommended GYN oncology consult.  This has been arranged for the patient.  The patient is supposed to  follow up with the GYN oncologist, Dr. De Blanch, on August 12, 2006 at 4 p.m.   The patient has done well and will be discharged back home today.   DISCHARGE PLANS:  1. To discharge the patient home today.  2. To follow up with the primary care Falisa Lamora in 1 week.  3. Follow up with the GYN oncologist on August 12, 2006 at 4 p.m.  4. Cardiac diet.  5. Activity as tolerated.  Barnetta Chapel, MD  Electronically Signed     SIO/MEDQ  D:  07/28/2006  T:  07/28/2006  Job:  [id number]   cc:   [name]. Cliffton Asters, M.D.  Fax: [phone]   De Blanch, M.D.  [address]  [place]  [place] [address]

## 2010-05-22 NOTE — Assessment & Plan Note (Signed)
[name] is back regarding her left above-the-knee amputation.  She  continues to have complaints of arthritic pain.  Her most prominent  complaint today is her left shoulder.  The knee is doing a bit better.  She continues to have pain in her feet and hands at times as well.  She  missed her therapy and is out of the therapy schedule currently for gait  training and wants to get back in.  She still is waiting on a ramp at  her house to [facility].  She rates her pain as 6/10.  She wears  her leg somewhat during the day but is not using it for ambulation in  the house as of yet.  Sleep is poor at times.  Pain increases with  bending, sitting, standing, and general activities.  Improves with rest,  medications, and therapy.  She uses Neurontin for her neuropathic pain  with good control.  She is on Percocet for breakthrough symptoms per Dr.  August Saucer.   SOCIAL HISTORY:  The patient is married, lives with her husband and son,  who are very supportive.  No specific changes are noted today.   PHYSICAL EXAM:  VITAL SIGNS:  Blood pressure is 142/61, pulse is 80,  respiratory rate 18.  She is saturating 98% on room air.  GENERAL:  The patient is pleasant, in no acute distress.  She is alert  and oriented x3.  Affect is bright and appropriate.  EXTREMITIES:  Right knee movement and motion is stable.  She had some  sclerotic joints in the foot and hand today.  __________  remains 1 out  of 2 in the distal limbs.  Left residual limb is intact.  Left shoulder  is notable for decreased range of motion and positive impingement signs.  She had approximately 65 to 70 degrees of flexion, 10 degrees of  external rotation, and 20 degrees of internal rotation at the shoulder.  She had minimal pain with bicipital provocation today.  Cognitively, she  is intact.  Tinel's tests were equal in both wrists.  HEART:  Regular.  CHEST:  Clear.  ABDOMEN:  Soft, nontender.   ASSESSMENT:  1. Left above-the-knee  amputation.  2. Peripheral vascular disease and peripheral myopathy.  3. History of carpal tunnel syndrome.  4. Osteoarthritis of the knees and multiple joints.  5. __________  syndrome.   PLAN:  1. After informed consent, we injected the left shoulder, via the      posterior approach, with 40 mg of Kenalog and 3 mL of 1% lidocaine.      The patient tolerated well.  2. Continue Neurontin for neuropathic pain control.  3. Resume physical therapy.  4. Consider nerve conduction studies in the future, although I see no      progression today.  5. Recommended over-the-counter glucosamine/chondroitin for arthritic      pain.  6. The patient needs to contact [facility] regarding followup of      ramp.  7. Will see the patient back in about 3 months' time.      Ranelle Oyster, M.D.     ZTS/MedQ  D:  05/19/2006 10:21:40  T:  05/19/2006 11:09:36  Job #:  [id number]   cc:   G. Dorene Grebe, M.D.  Fax: [phone]   [name]. Cliffton Asters, M.D.  Fax: [phone]

## 2010-05-22 NOTE — Op Note (Signed)
NAMEJACKALYN, [name]             ACCOUNT NO.:  [id number]   MEDICAL RECORD NO.:  [id number]          PATIENT TYPE:  INP   LOCATION:  5151                         FACILITY:  [facility]   PHYSICIAN:  Graylin Shiver, M.D.   DATE OF BIRTH:  1927/09/27   DATE OF PROCEDURE:  06/25/2008  DATE OF DISCHARGE:                               OPERATIVE REPORT   SURGEON:  Graylin Shiver, MD   PROCEDURE:  Endoscopic retrograde cholangiopancreatography.   INDICATIONS FOR PROCEDURE:  1. Gallstone pancreatitis.  2. Worsening leukocytosis despite being on antibiotics, rule out      ascending cholangitis.  3. Abnormal HIDA scan showing no nuclear tracer in the common bile      duct or intestine.   Informed consent was obtained after explanation of the risks of  bleeding, infection, and perforation.   PREMEDICATION:  Total dose of fentanyl 75 mcg IV, Versed 7.5 mg IV,  glucagon 1.5 mg IV.   PROCEDURE:  With the patient lying on her abdomen on the fluoroscopy  table, the lateral viewing duodenum scope was inserted into the  oropharynx and passed into the esophagus.  It was advanced down the  esophagus then into the stomach and then into the duodenum.  There were  no obvious lesions seen in the stomach or the duodenum.  The papilla of  Vater was located, and it seemed a little edematous.  A cannulation was  attempted with a guidewire and papillotome.  I attempted to advance the  guidewire directly up into the common duct selectively; however, the  guidewire would not go up into the common duct.  I did get advancement  of the guidewire into the pancreatic duct a couple of times.  Despite  numerous attempts at selectively cannulating the bile duct with the  papillotome and guidewire, I could not get the guidewire to go up.  After this and knowing that my tip of the papillotome was in the  papillary orifice, I went ahead and injected contrast, which filled both  the common bile duct and pancreatic duct at  the same time suggesting  that there is a common channel.  The pancreatic duct looked normal.  The  common bile duct and common hepatic duct looked normal and no obvious  stones were seen.  I reviewed these x-rays with the radiologist.  The  cystic duct filled and some gallbladder filling was also noted.  I did  not over inject because I did not want to keep injecting contrast into  the pancreatic duct.  No sphincterotomy could be done because I could  not selectively get a guidewire up the common bile duct.  At the  beginning of the cannulation session, no bile was seen.  After  cannulation, however, I could see a yellowish bile coming out and  fortunately there was no purulent material seen; therefore, I do not  think this patient has ascending cholangitis.  She tolerated the  procedure well without immediate complications.   IMPRESSION:  No obvious filling defects in the biliary tree.  There is  filling of the cystic duct.  Selective  cannulation could not be achieved  at the biliary tree.  The pancreatic duct looked normal.   Films were reviewed with the radiologist.   PLAN:  I would observe her clinically for now and follow her labs;  hopefully, her pancreatitis settles down more and we can proceed with  laparoscopic cholecystectomy with intraoperative cholangiograms next  week.           ______________________________  Graylin Shiver, M.D.     SFG/MEDQ  D:  06/25/2008  T:  06/26/2008  Job:  [id number]   cc:   [facility]

## 2010-05-22 NOTE — Consult Note (Signed)
NAMEKASHANA, BREACH             ACCOUNT NO.:  [id number]   MEDICAL RECORD NO.:  [id number]          PATIENT TYPE:  OUT   LOCATION:  GYN                          FACILITY:  [facility]   PHYSICIAN:  De Blanch, M.D.DATE OF BIRTH:  [birth year]   DATE OF CONSULTATION:  08/12/2006  DATE OF DISCHARGE:  08/12/2006                                 CONSULTATION   The patient presented to the clinic today as a new patient consultation.  She was placed in the examining room but when told that she would need  to be examined as part of the consultation refused and left the clinic.  We attempted to reschedule her appointment but she did not wish to do  this.  We will communicate back to her referring physician so that they  are aware that the patient was not seen.      De Blanch, M.D.  Electronically Signed     DC/MEDQ  D:  09/02/2006  T:  09/03/2006  Job:  [id number]   cc:   Telford Nab, R.N.  [address]  [place], [place] [address]

## 2010-05-22 NOTE — H&P (Signed)
[name], [name]             ACCOUNT NO.:  [id number]   MEDICAL RECORD NO.:  [id number]          PATIENT TYPE:  INP   LOCATION:  4733                         FACILITY:  [facility]   PHYSICIAN:  Hollice Espy, M.D.DATE OF BIRTH:  1927/01/19   DATE OF ADMISSION:  06/23/2008  DATE OF DISCHARGE:                              HISTORY & PHYSICAL   CHIEF COMPLAINT:  Nausea and abdominal pain.   HISTORY OF PRESENT ILLNESS:  [name] is a 77 year old female who  notes nausea and vomiting which started at 10 p.m. last night.  This is  associated with abdominal and chest pain which radiated to her back.  She also notes positive diarrhea, sweats, and subjective fever.  The  patient's son lives with her.  The patient called EMS to come pick her  up this morning saying son would not take her.  We are asked to admit  her for further evaluation and treatment.   ALLERGIES:  PENICILLIN.   PAST MEDICAL HISTORY:  Hypertension and peripheral vascular disease and  cataracts.   PAST SURGICAL HISTORY:  1. Left AKA.  2. Bilateral tubal ligation.  3. Cataract surgery.   SOCIAL HISTORY:  The patient was previously married and divorced, then  remarried and now widowed.  She denies tobacco or alcohol.  She reports  highest level of education obtained was third grade.  She has 9  children.   FAMILY HISTORY:  Mother is deceased at age 1, cause unclear.  Father is  deceased secondary to CVA.   MEDICATIONS:  1. Atenolol 100 mg p.o. daily.  2. Maxzide 70/50 mg 1 tablet p.o. daily.  3. Premarin 0.3 mg p.o. daily.   REVIEW OF SYSTEMS:  GENERAL:  Notes positive sweats, positive subjective  fever.  ENT:  Denies nasal congestion.  CARDIOVASCULAR:  No  palpitations.  RESPIRATORY:  Mild shortness of breath.  GI:  Notes  positive nausea, vomiting, and diarrhea.  Did not look at stool, unable  to describe stool characteristics.  GU:  No dysuria.  PSYCHIATRIC:  No  depression, no anxiety.  NEURO:  No  visual, no hearing changes.  SKIN:  No rashes.  MUSCULOSKELETAL:  Occasional pain in her hands when it  rains.   LABORATORY/RADIOLOGY:  KUB:  No acute cardiopulmonary disease,  nonspecific bowel gas pattern, right lower abdomen air-fluid levels with  mild paucity of gas.  Sodium 140, potassium 2.9, chloride 103, bicarb  24, BUN 16, creatinine 1.55, and glucose 135.  Lipase greater than 2000.  UA:  Small leukocytes, trace blood, positive protein, white blood cell  count 7-10.  Troponin less than 0.05, myoglobin 28, MB 15, AST 108,  hemoglobin 12.9, hematocrit 38.6.   PHYSICAL EXAMINATION:  VITAL SIGNS:  BP 142/41, heart rate 53,  respiratory rate 18, temp 97.7, and O2 sat 97% on room air.  GENERAL:  The patient is awake, alert, elderly female in no acute  distress.  NECK:  No thyroid enlargement.  CARDIOVASCULAR:  S1 and S2.  Regular rate and rhythm.  No lower  extremity edema is noted.  RESPIRATORY:  Breath sounds clear to  auscultation bilaterally without  wheezes, rales, or rhonchi.  No increased work of breathing.  ABDOMEN:  Soft, positive epigastric/left upper quadrant tenderness.  PSYCHIATRIC:  A&O x3, calm and pleasant.  SKIN:  No rashes.  Positive tattoo noted, right upper extremity.  EXTREMITIES:  Left AKA, no lower extremity swelling.   ASSESSMENT AND PLAN:  1. Acute pancreatitis.  Her meds have been reviewed, and it is noted      that Premarin can cause pancreatitis, so we will discontinue this      medication.  We will give her IV fluid, keep her n.p.o. except for      meds, morphine, and Vicodin p.r.n. pain and Lovenox for DVT      prophylaxis, Zofran p.r.n. nausea and vomiting.  We will also check      an LDH and abdominal ultrasound to rule out gallstone pancreatitis,      and if positive we will consult GI.  We will check repeat liver      function test, amylase, and lipase in a.m. as well as CMET and CBC.      We will place her on IV Rocephin for empiric antibiotic  coverage of      acute pancreatitis as well as urinary tract infection.  2. Hypertension.  BP is stable.  Continue home beta-blocker.  We will      hold diuretic secondary to acute renal insufficiency.  3. Hypokalemia.  Replete p.o.  She is status post 40 mEq p.o. in the      ED.  We will repeat a BMET in the morning and add 20 of K to her IV      fluid x1 L.  4. Peripheral vascular disease.  Currently stable at baseline.  5. Acute renal insufficiency secondary to dehydration.  We will give      IV fluids, repeat BMET in a.m.  6. Urinary tract infection.  We will check culture and start Rocephin      IV.   Also note abnormal EKG, suspect incorrect lead placement.  We will ask  for this to be repeated.      Sandford Craze, NP      Hollice Espy, M.D.  Electronically Signed    MO/MEDQ  D:  06/23/2008  T:  06/24/2008  Job:  [id number]   cc:   [name]. Cliffton Asters, M.D.

## 2010-05-22 NOTE — Discharge Summary (Signed)
NAMEDOROTHYE, BERNI             ACCOUNT NO.:  [id number]   MEDICAL RECORD NO.:  [id number]          PATIENT TYPE:  INP   LOCATION:  5006                         FACILITY:  [facility]   PHYSICIAN:  Corinna L. Lendell Caprice, MDDATE OF BIRTH:  06/15/1927   DATE OF ADMISSION:  07/15/2008  DATE OF DISCHARGE:  07/23/2008                               DISCHARGE SUMMARY   DISCHARGE DIAGNOSES:  1. Urinary tract infection, treated.  2. Resolving biliary pancreatitis.  3. Acute renal failure.  4. Dehydration.  5. Peripheral vascular disease.  6. Ovarian cyst.  7. Hypertension.  8. Resolved diarrhea.   DISCHARGE MEDICATIONS:  1. Darvocet 1 q.4 h. p.r.n. pain prescribed by Dr. Zachery Dakins, 20 were      dispensed.  2. Stop cholestyramine.  3. Continue Premarin 0.3 mg a day.  4. Atenolol 100 mg a day.  5. Stop triamterene and hydrochlorothiazide.  6. Stop hydrocodone 5/500 a day.  7. Continue promethazine 12.5 mg every 6-8 hours as needed for pain.   FOLLOWUP:  Follow up with Dr. Zachery Dakins in 2 weeks.  Follow up with Dr.  Laurann Montana as previously scheduled.   ACTIVITY:  Continue transfer to wheelchair as previous.  Home physical  therapy and occupational therapy has been arranged.   WOUND CARE:  Leave Steri-Strips on for a week.   DIET:  No restrictions.   CONDITION:  Stable.   PROCEDURES:  Laparoscopic cholecystectomy with drain, which has since  been removed.   CONSULTATIONS:  General Surgery.   LABORATORIES:  On admission, she had a CBC, which showed a normal white  count.  Her hemoglobin was 11.6, hematocrit 34.5, and normal platelet  count.  Complete metabolic panel on admission significant for a BUN of  22 and creatinine 2.36.  Total bilirubin was 1.4.  Total protein was  8.6, albumin 3.6, and lipase was 81.  Her creatinine on July 16 was 1.02  and her BUN was 3.  Lipase on July 13 was 40.  Myoglobin 260.  Urinalysis showed negative nitrite, moderate leukocyte esterase, 3-6  white cells, and rare bacteria.   SPECIAL STUDIES/RADIOLOGY:  CT of the abdomen and pelvis on July 12  showed stable left ovarian cystic mass, improving changes of acute  pancreatitis, resolved bilateral pleural effusion and atelectasis,  cholelithiasis with minimal pericholecystic fluid.  Multiple small  proximal left ureteral calculi without hydronephrosis, largest measuring  6 mm.  Bilateral L5 spondylolysis with grade 1 spondylolisthesis at L5-  S1, transverse colon, and gastric ileus.  Stable left adrenal mass.  EKG  showed normal sinus rhythm.   HISTORY AND HOSPITAL COURSE:  Ms. Montas is a 77 year old female,  patient of Dr. Cliffton Asters, who was admitted on July 9 with abdominal pain,  nausea, vomiting, and diarrhea.  She had recently been hospitalized with  biliary pancreatitis and had had an ERCP, and a surgical evaluation at  that time.  The plan was for outpatient cholecystectomy.  However, the  patient returned with worsening symptoms.  Please see H and P for  complete admission details.  She was found to be in acute renal failure,  felt to be secondary to dehydration in the setting of diuretic use.  She  was admitted and given IV fluids, supportive care.  Her lipase was noted  to be elevated, but she mainly had lower abdominal pain.  She had no  tenderness and normal bowel sounds and was nondistended on exam.  She  was afebrile and had normal vital signs on admission.  She is felt to  have a urinary tract infection.  After her creatinine normalized, CAT  scan was done.  Results as above.  Dr. Rito Ehrlich consulted General  Surgery, who subsequently performed laparoscopic cholecystectomy without  incident.  The patient's renal failure resolved and I have instructed  her not to resume her triamterene and hydrochlorothiazide.  She has had  no diarrhea since her hospitalization.  Her vital signs are normal and  she is tolerating a diet.  She has worked with Physical Therapy here.  Her  drain has been removed and she has been cleared for discharge by  Surgery.  I discussed plans with the patient and her son separately.      Corinna L. Lendell Caprice, MD  Electronically Signed     Corinna L. Lendell Caprice, MD  Electronically Signed    CLS/MEDQ  D:  07/23/2008  T:  07/23/2008  Job:  [id number]   cc:   [name]. Cliffton Asters, M.D.  Anselm Pancoast. Zachery Dakins, M.D.  Graylin Shiver, M.D.

## 2010-05-22 NOTE — Assessment & Plan Note (Signed)
Polina is back regarding her left above-knee amputation.  She has not  been wearing her left stump shrinker.  She has not made it to therapy.  She states they never called her.  She has had an inconsistent track  record getting to therapy.  Her pain is 0 out of 10.  The shoulder  injection we performed in the left helped her substantially.  She uses  an occasional Percocet given by her family doctor.  She stopped the  Neurontin.  She is sleeping fairly well.   REVIEW OF SYSTEMS:  Notable for the above.  Full review is in the  written health and history section of the chart.   SOCIAL HISTORY:  The patient is married and lives with her husband and  son who remains supportive.   PHYSICAL EXAMINATION:  Blood pressure is 149/65, pulse is 71,  respiratory rate 18.  She is satting 98% on room air.  The patient is pleasant, alert and oriented x3.  Left leg remains edematous compared to the right.  She had no stump or  distal pain.  She complained of no phantom pain, per se, today.  She has  full range of motion of the leg on the left.  Strength was generally 4-4+ out of 5 at the left hip.  Left shoulder was  improved with better tolerance of flexion, extension, and rotation  today.  She had minimal pain with impingement maneuvers.  Tinel's test  were equivocal in both wrists.  HEART:  Regular.  CHEST:  Clear.  ABDOMEN:  Soft, nontender.   ASSESSMENT:  1. Left above-knee amputation.  2. Peripheral vascular disease and peripheral neuropathy.  3. History of carpal tunnel syndrome.  4. Osteoarthritis of the knees and mobile joints.  5. Left rotator cuff syndrome.   PLAN:  1. I recommended reusing stump shrinker to compress leg and improve      edema.  Once she comes to a steady state regarding her left leg,      consider a trial of prosthesis again.  She may need adjustment of      her cuff socket and she has gained weight, overall, but I would      like to see her wear the shrinker for  at least 2-4 weeks prior.  2. Resume Neurontin on daily basis for neuropathic pain control.  Use      Percocet p.r.n.  3. Once she shrinks leg and wears the prosthesis again and has a ramp      installed at home, consider retrial of      physical therapy.  4. I will see her back in about 2 months' time.      Ranelle Oyster, M.D.  Electronically Signed     ZTS/MedQ  D:  08/18/2006 12:45:25  T:  08/19/2006 11:27:30  Job #:  [id number]   cc:   G. Dorene Grebe, M.D.  Fax: [phone]   [name]. Cliffton Asters, M.D.  Fax: [phone]

## 2010-05-22 NOTE — Consult Note (Signed)
[name], [name]             ACCOUNT NO.:  [id number]   MEDICAL RECORD NO.:  [id number]          PATIENT TYPE:  INP   LOCATION:  5151                         FACILITY:  [facility]   PHYSICIAN:  Sandria Bales. Ezzard Standing, M.D.  DATE OF BIRTH:  04-07-1927   DATE OF CONSULTATION:  06/24/2008  DATE OF DISCHARGE:                                 CONSULTATION   Date of Consutlation - 24 June 2008   REQUESTING PHYSICIAN:  Dr. Lendell Caprice.   CONSULTING SURGEON:  Sandria Bales. Ezzard Standing, MD   PRIMARY CARE PHYSICIAN:  [name], MD   REASON FOR CONSULTATION:  Pancreatitis, presumably biliary in etiology.   HISTORY OF PRESENT ILLNESS:  [name] is a 77 year old female with a  history of peripheral vascular disease and hypertension, who tells me  she started having abdominal pain last Friday with associated nausea and  vomiting starting on Saturday.  However, the patient currently seems  sleepy and somewhat confused at times.  According to the chart, the  patient started having right upper quadrant and left upper quadrant  abdominal pain this past Wednesday around 10 p.m.  She then developed  associated nausea and vomiting after this onset.  She does admit to  radiation of pain to her back as well as some subjective fevers, but no  chills or shortness of breath.  Apparently, due to this pain, the  patient called EMS on the day of admission to pick her up.  After she  was brought here, she was found to have a lipase of greater than 2000,  and an ultrasound that showed stones and sludge.  At this time, a HIDA  scan was then obtained, which did not show a visualization of the  gallbladder or small bowel.  Because of this as well as an increase in  total bilirubin of 4.7, we were consulted for surgical intervention.   REVIEW OF SYSTEMS:  Please see HPI, otherwise, all other systems are  negative.   PAST MEDICAL HISTORY:  1. Peripheral vascular disease.  2. Hypertension.   PAST SURGICAL HISTORY:  1. Multiple surgeries, vascular related and orthopedic related on her      left leg that led to an eventual left above the knee amputation 2-      1/2 years ago.  2. An open bilateral tubal ligation.   SOCIAL HISTORY:  The patient was married, but is now widowed.  She has  no children.  She has never smoked and never drank.   ALLERGIES:  PENICILLIN.   MEDICATIONS:  1. Atenolol 100 mg daily.  2. Triamterene and hydrochlorothiazide 75/50 mg daily.  3. Premarin 0.3 mg daily.   PHYSICAL EXAMINATION:  GENERAL:  [name] is a 77 year old female  who is currently fairly sleepy, lying in bed, in no acute distress. She  is somewhat confused.  VITAL SIGNS:  Temperature 98.5, pulse 80, respirations 20, and blood  pressure 147/66.  HEENT:  Head is normocephalic, atraumatic.  Sclerae noninjected and  nonicteric.  Pupils are equal, round, and reactive to light.  Ears and  nose without any obvious masses or lesions.  No rhinorrhea.  Mouth is  pink and moist.  Throat shows no exudate.  NECK:  Supple.  Trachea is midline.  No thyromegaly.  HEART:  Regular rate and rhythm.  Normal S1 and S2.  No murmurs,  gallops, or rubs are noted.  She does have good carotid and radial  pulses bilaterally, and she has a good pedal pulse in her right foot.  LUNGS:  Clear to auscultation bilaterally.  No wheezes, rhonchi, or  rales noted.  Respiratory effort is nonlabored.  ABDOMEN:  Soft, but diffusely tender.  Most of this tenderness is in her  bilateral upper quadrants as well as epigastric region, but does have  some slight pain in her lower quadrants as well.  She is, otherwise,  nondistended, but obese with a lower midline incisional scar noted from  her prior tubal ligation.  Otherwise, no masses or hernias were noted.  MUSCULOSKELETAL:  Upper extremities were symmetrical.  She has a left  above the knee amputation.  No cyanosis, clubbing, or edema is noted.  SKIN:  She has a small tattoo on her right  upper extremity.  Otherwise,  no rashes, lesions, or masses.  NEURO:  Cranial nerves II through XII appear to be grossly intact.  PSYCH:  The patient seems to be somewhat confused at times, but is,  otherwise, oriented to person, place, and time.   LABS AND DIAGNOSTICS:  Lipase today is 1315 and amylase is 1644.  Sodium  142, potassium 3.9, glucose 90, BUN 18, creatinine 1.46, total bilirubin  of 4.7, alkaline phosphatase of 82, AST of 53, and ALT of 23.  White  blood cell count is 18,300, which is up from 9400 on admission;  hemoglobin 12.1; hematocrit 36.6; and platelet count is 137,000.  EKG  shows normal sinus rhythm with a first-degree AV block.   Hepatobiliary scan of the gallbladder shows nonvisualization biliary  system after about 2 hours (suggestings CDB obstruction).  Ultrasound of  the abdomen revealed gallstones and sludge within the gallbladder, but  no evidence of acute cholecystitis, and nonvisualization of the inferior  vena cava and the pancreas.   IMPRESSION:  1. Pancreatitis, presumed to be biliary in etiology.  2. Leukocytosis.  3. Hyperbilirubinemia.  Probable common bile duct obstruction.  4. Peripheral vascular disease.  5. Hypertension.  6. Mild confusion.  7. Pruritis, questionably secondary to an increasing bilirubin.   PLAN:  At this time, the patient's amylase and lipase remain over 1000.  Once these begin to resolve and close to normal, then surgical  intervention can be achieved at that time for a laparoscopic  cholecystectomy.  However, in the meantime, the patient's total  bilirubin is on the rise.  It has increased from 1.2 to 4.7 in 1 day,  and patient needs a GI  consult to consider ERCP.  In the meantime, we will check labs in the  morning, and otherwise, we will follow along with you until the patient  is stable for laparoscopic cholecystectomy.   [Dr. Luan Moore will see the patient in consultation.  DN]      Letha [place],  [place]      Sandria Bales. Ezzard Standing, M.D.  Electronically Signed    KEO/MEDQ  D:  06/24/2008  T:  06/25/2008  Job:  [id number]   cc:   [name]. Cliffton Asters, M.D.  Graylin Shiver, M.D.
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
## 2010-05-25 NOTE — Assessment & Plan Note (Signed)
[facility]   NAME:  [name], [name]             ACCOUNT NO.:  [id number]   MEDICAL RECORD NO.:  [id number]             DATE OF BIRTH:   PHYSICIAN:  Theresia Majors. Tanda Rockers, M.D. VISIT DATE:  07/09/2005                                     OFFICE VISIT   SUBJECTIVE:  [name] is a 77 year old lady with chronic osteomyelitis  of her right tib-fib.  She is currently undergoing hyperbaric oxygen  treatment and is at her #14 dive.  During the interim she reports that there  has been less drainage.  There is less malodor and less pain.   OBJECTIVE:  Her vital signs are stable.  She is afebrile.  The wound was  copiously irrigated with baking solution and irrigated with normal saline.  There was some debris that was agitated freely.  The debris was a  combination of bony fragments as well as some fresh hemorrhage and serous  drainage.  The wound was curetted with similar debris removed.  A __________  wick was placed into the depths of the wound and exited into a bulk of 4x4s  which will serve as a reservoir.  A sterile Kerlix and slight compression  was placed on the wound.  The neurovascular integrity remains unchanged.   ASSESSMENT:  Improvement by reason of decreased drainage, malodor, and  decrease in debridement products.   PLAN:  Will continue the HBO treatment and reevaluate in five days.           ______________________________  Theresia Majors Tanda Rockers, M.D.     [name]  D:  07/09/2005  T:  07/09/2005  Job:  [id number]

## 2010-05-25 NOTE — Discharge Summary (Signed)
[name], [name]             ACCOUNT NO.:  [id number]   MEDICAL RECORD NO.:  [id number]          PATIENT TYPE:  INP   LOCATION:  5743                         FACILITY:  [facility]   PHYSICIAN:  Jackie Plum, M.D.DATE OF BIRTH:  Jan 30, 1927   DATE OF ADMISSION:  09/30/2005  DATE OF DISCHARGE:  10/09/2005                                 DISCHARGE SUMMARY   DISCHARGE DIAGNOSES:  1. Left tibia chronic osteomyelitis, status post left above the knee      amputation by Dr. Dorene Grebe on October 06, 2005.  2. Anemia of chronic disease.  3. Hypertension.  4. Gastroesophageal reflux disease.  5. Irritable bowel syndrome.  6. History of bilateral carpal tunnel syndrome.  7. History of ALLERGY TO IODINE AND PENICILLINS.   MEDICATIONS ON DISCHARGE:  1. Atenolol 100 mg daily.  2. Cefepime 2 gram IV q.24 hours.  3. Premarin 0.3 mg p.o. daily.  4. Lovenox 40 mg subcutaneously q.24 hours.  5. Morphine PCA pump.  6. Protonix 40 mg p.o. daily.  7. Vancomycin 1 gram IV q.24 hours.  8. Tylenol 650 mg p.o. q.6 hours p.r.n.  9. BiDil 25 mg p.o. q.6 hours p.r.n.  10.Robaxin 500 mg p.o. q.6 hours p.r.n.  11.Reglan 10 mg IV q.6 hours p.r.n.  12.Zofran 1 mg IV q.6 hours p.r.n.  13.Percocet one or two tablets p.o. q.4 hours p.r.n.   CONSULTATIONS:  Dr. August Saucer.   PROCEDURES:  As above.   CONDITION ON DISCHARGE:  Improved, satisfactory.   REASON FOR ADMISSION:  Chronic left leg osteomyelitis.   The patient was seen and admitted by my colleague, Dr. Deirdre Peer. Polite, on  October 05, 2005 at that time with a chief complaint of left leg  infection.  She has been seen by her primary care physician.  She has  history of chronic left leg osteomyelitis.  [facility] Nicole Kindred as well and has undergone hyperbaric  oxygen treatment previously.  Despite that, this patient continued to worsen  and she has come to the hospital for the purpose  amputation.   On admission, the patient was alert and oriented.  Her cardiopulmonary  enzymes were unremarkable.  She had decreased pulses in both dorsalis pedis  and posterior tibial vasculature.  She had an extensive open wound  ___________ and a foul odor with drainage.  She was admitted for IV  antibiotics to hospital service.  MRI was done, as well as vascular  assessment of ABI.  The patient was seen by Dr. August Saucer of orthopedics.  MRI  had confirmed osteomyelitis and amputation was recommended.  Patient  underwent amputation successfully as described above.  Her postoperative  course was unremarkable.  She was seen by PT/OT who recommended rehab  evaluation for possible obtaining inpatient ___________.  Patient was seen  by [facility] who agreed that she would benefit from amputation and  assessment and care by orthopedist for rehab transfer.  Dr. August Saucer had  indicated he would refer at rehab.  Finally, patient transferred and this  dictation is being done in  anticipation to her transfer sometime today.  On  rounds today, patient denies any fevers or chills, nausea or vomiting.  She  is in no acute distress.  She is comfortable looking.  Her cardiopulmonary  auscultations are unremarkable.  Her hemodynamics are stable with discharge  BP of 126/56, pulse is 72, respirations 20, temp 97.3.  O2 saturation 95% on  room air.  Her stump wound is dressed and was not evaluated.  Her last lab  from yesterday, October 08, 2005, indicated a WBC count of 7.1, hemoglobin of  9.4, hematocrit 28.6, MCV 80.4, platelet count 184.  Sodium 142, potassium  4.5, chloride 109, CO2 25, glucose 107, BUN 6, creatinine 1.0, calcium 8.7.  Patient discharged in stable, satisfactory condition.      Jackie Plum, M.D.  Electronically Signed     GO/MEDQ  D:  10/09/2005  T:  10/09/2005  Job:  [id number]   cc:   G. Dorene Grebe, M.D.
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
## 2010-05-25 NOTE — Assessment & Plan Note (Signed)
Tuesday, December 03, 2005   [name] is back regarding her left above-knee amputation.  She has done  quite well postoperatively.  The pain is much reduced.  She is sleeping  well.  She is transferring with her walker at home.  She does simple  household tasks and has been working in [employer].  She is ready for  prosthetic fitting today.   REVIEW OF SYSTEMS:  The patient denies any new neurological,  psychiatric, constitutional, GI, GU or cardiorespiratory complaints  today.   SOCIAL HISTORY:  The patient lives with her husband and son, who is very  supportive.  The son is with her today.   PHYSICAL EXAMINATION:  Blood pressure is 167/70, pulse is 61,  respiratory rate is 16.  She is satting 99% on room air.  The patient is pleasant, in no acute distress.  She is alert and  oriented x3.  Affect is bright and appropriate.  The left knee is well-healed with significant decrease in swelling  noted.  There was 1 staple left centrally on the wound line, which I  removed.  The area was minimally tender to palpation today.  There were  a few scabs on the wound line, but generally this area was nicely  healed.  She had +5 degrees of extension at the hip today.  The right  leg was stable.  The patient is wearing an extra-depth shoe to relieve  the toes and metatarsal areas.   ASSESSMENT:  Left above-knee amputation secondary to osteomyelitis.  The  patient is very active, and will return to K3 ambulation/activity level.   PLAN:  The patient will be fitted with a polycentric knee and  silicone/waist belt suspension system.  She will receive an energy-  returning foot as well, which will allow her to be integrated into the  community.  The patient will be fitted over the next 2 to 3 weeks' time,  at which she will begin outpatient physical therapy.  The patient was  pleased with the plan.  I had nothing further to offer from a medicinal  standpoint today.  I will see her back in the future  as needed.      Ranelle Oyster, M.D.  Electronically Signed     ZTS/MedQ  D:  12/03/2005 17:00:01  T:  12/04/2005 01:08:07  Job #:  [id number]   cc:   G. Dorene Grebe, M.D.  Fax: [phone]   [name]. Cliffton Asters, M.D.  Fax: [phone]

## 2010-05-25 NOTE — Consult Note (Signed)
NAMELAVAEH, BAU             ACCOUNT NO.:  [id number]   MEDICAL RECORD NO.:  [id number]           PATIENT TYPE:   LOCATION:                                 FACILITY:   PHYSICIAN:  Theresia Majors. Tanda Rockers, M.D.DATE OF BIRTH:  1927/10/13   DATE OF CONSULTATION:  06/10/2005  DATE OF DISCHARGE:                                   CONSULTATION   REASON FOR CONSULTATION:  Ms. Tolle is a 77 year old female who has been  referred by Dr. Reche Dixon for evaluation of a chronic osteomyelitis involving  the left lower extremity.   IMPRESSION:  Chronic osteomyelitis.   RECOMMENDATIONS:  Consider patient for hyperbaric oxygen treatment following  reveal of her CBC, BMET, PA and lateral chest films, cultures, and  echocardiogram.   SUBJECTIVE:  Mr. Santoli has had a draining sinus on her left lower  extremity for approximately 40 years.  It is occasionally associated with  redness and extreme malodor.  It drains daily necessitating changes of  bandages four to six times.  It has been recommended that she undergo  amputation in the past, but she has adamantly refused.  She is ambulatory  with a crutch.  She is a nondiabetic.   PAST MEDICAL HISTORY:  The patient's past medical history is remarkable for  hypertension and hyperthyroidism.   MEDICATIONS:  Current medication list includes:  1.  Fiber caplets 1 a day.  2.  Aspirin 325 mg a day.  3.  Hydrocodone 5/500 twice a day.  4.  Flexeril 1/2-1 tablet daily.  5.  Levothyroxine 25 mcg daily.  6.  Maxzide 75/50, 1 a day.  7.  Atenolol 100 mg daily.  8.  Cefazalin 500 mg q.i.d.   PAST SURGICAL HISTORY:  The patient's past surgeries include a tubal  ligation and multiple surgeries over the past five to 20 years on her left  lower extremity.   FAMILY HISTORY:  The patient's family history is negative for diabetes; and,  positive for hypertension, stroke and heart attack.  The patient has nine  children who live locally.   SOCIAL HISTORY:   The social history is specifically negative for a history  of tobacco use or ethanol abuse.   REVIEW OF SYSTEMS:  The patient denies angina pectoris.  She remains  ambulatory and provides for her own personal needs.  She denies weight loss,  fever, bowel or bladder dysfunction.  The remainder of the review of systems  is negative.   PHYSICAL EXAMINATION:  GENERAL APPEARANCE:  On physical exam she is an alert  and oriented lady appearing her stated age.  She is in good contact with  reality.  VITAL SIGNS:  The patient's vital signs are stable.  She is afebrile.  HEENT:  The head, eyes, ears, nose and throat exam is clear.  NECK:  The neck is supple.  Trachea is midline.  Thyroid is nonpalpable.  LUNGS:  The lungs are clear.  BREASTS:  The breast exam is deferred.  ABDOMEN:  The abdomen is soft without palpable masses.  EXTREMITIES: The extremity exam is remarkable for a draining malodorous  wound in the midportion of the left lower extremity.  The foot has atrophic  changes with clawing and an increased callous over the head of the fifth  metatarsal.  The pedal pulses are indeterminate, but there is no evidence of  ongoing ischemia.  The right lower extremity is essentially normal with  palpable posterior tibial pulse.  NEUROLOGIC EXAMINATION:  Neurologically she retains protective sensation in  both lower extremities.  There is no regional adenopathy.   DISCUSSION:  The patient and her husband were interviewed and they came to  the [facility] with great expectations of finding an effective cure for  this chronic osteomyelitic lesions.  We explained to them in great detail  that the chronicity of this wound suggests that it would be difficult to  effect an 100% cure; however, hyperbaric oxygen treatment has been found to  be effective in decreasing the bacterial load and augmenting the effects of  her antibiotics.  We recommend that we give her trial of hyperbaric oxygen  treatment  to see the effects on her lesion.  We have specifically suggested  that while a complete resolution of chronic osteomyelitis may not be  accomplished hyperbaric oxygen alone there may be the potential for repeated  curettings of the dead bone, and this may, in fact, result in significant  improvement with decrease in pain, decrease in drainage and more social  acceptability of the lesion.   We will proceed with a routine screening of her laboratory,  we will try to  retrieve old records from her orthopedist, Dr. Lajoyce Corners, and we will reevaluate  the patient in one week.  We have ordered a CBC,  BMET, PA and lateral chest  film, electrocardiogram, and an echocardiogram.  The patient and her husband  seem to understand these explanations and wish to proceed with the  evaluation.  We have not committed to hyperbaric oxygen treatment at this  point.           ______________________________  Theresia Majors. Tanda Rockers, M.D.     [name]  D:  06/10/2005  T:  06/10/2005  Job:  [id number]   cc:   Dineen Kid. Reche Dixon, M.D.  Fax: [phone]

## 2010-05-25 NOTE — Assessment & Plan Note (Signed)
[facility]   NAME:  Rebekah, Henry             ACCOUNT NO.:  [id number]   MEDICAL RECORD NO.:  [id number]      DATE OF BIRTH:  1927-08-05   PHYSICIAN:  Theresia Majors. Tanda Rockers, M.D. VISIT DATE:  06/17/2005                                     OFFICE VISIT   [name] returns for follow-up of a left lower extremity osteomyelitis.  The patient was initially seen on June 10, 2005, and we ordered a battery of  labs to be accomplished prior to [facility].  In the interim she has complained of  continued drainage and some moderate pain which has been chronic over the  years.  She denies increase in fever.   OBJECTIVE:  VITAL SIGNS:  Temperature 98.1, pulse rate 68, respirations 18,  blood pressure 120/60.  EXTREMITIES:  Examination of the left lower extremity shows that the sinuses  are essentially unchanged.  There is some malodorous drainage as a result of  irrigation with normal saline.  Pedal pulses remain palpable.   ASSESSMENT:  Chronic osteomyelitis.   RECOMMENDATIONS:  We will proceed with HVO to start this week.  We will plan  to __________ [name] at 2.4 atmospheres 100% oxygen with two five  minute air breaks.  We have discussed HVO with her in the presence of her  husband and her son in terms that they seem to understand.  They wish to  proceed with the therapy as outlined.  We have given them opportunity to  answer all their questions.  We have discussed the indications, the possible  complications and the expected results in terms that they all seem to  understand.  We will proceed with the evaluation of the left tib-fib x-ray  and also the echocardiogram.           ______________________________  Theresia Majors. Tanda Rockers, M.D.     [name]  D:  06/17/2005  T:  06/17/2005  Job:  [id number]

## 2010-05-25 NOTE — Assessment & Plan Note (Signed)
[facility]   NAME:  [name], [name]                 ACCOUNT NO.:  0   MEDICAL RECORD NO.:  [id number]      DATE OF BIRTH:  09-25-1927   PHYSICIAN:  Theresia Majors. Tanda Rockers, M.D. VISIT DATE:  06/21/2005                                     OFFICE VISIT   VITAL SIGNS:  Vital signs stable and afebrile.   PURPOSE OF TODAY'S VISIT:  [name] is a 77 year old lady with chronic  osteomyelitis of the left lower extremity.  She is currently undergoing  hyperbaric oxygen treatment with the clinical objectives of affecting  decreased drainage, decreased malodor and to facilitate primary healing.  During the interim, the patient has complained of continued drainage of  malodor.  She notes there has been some decrease in volume and decrease  soiling on her dressing.  She has undergone an interim irrigation.   WOUND EXAM:  The wound was copiously irrigated with Dakin solution and a  curet was used to debride the depths of the wound blindly.  Areas of frank  necrosis were extracted with the curet.  The edges of the bone were palpated  and sounded with the debrided instrument.  The wound was finally irrigated  with 60 mL of saline.  This resulted in flushing out of necrotic material.  No packing was replaced.   DIAGNOSIS:  Improvement with less malodor in the exudate and drainage from  the osteomyelitic lesion.   MANAGEMENT PLAN & GOAL:  We will continue the serial debridements and daily  HBO.           ______________________________  Theresia Majors. Tanda Rockers, M.D.     [name]  D:  06/21/2005  T:  06/21/2005  Job:  [id number]

## 2010-05-25 NOTE — Op Note (Signed)
[name], [name]             ACCOUNT NO.:  [id number]   MEDICAL RECORD NO.:  [id number]          PATIENT TYPE:  INP   LOCATION:  5743                         FACILITY:  [facility]   PHYSICIAN:  Burnard Bunting, M.D.    DATE OF BIRTH:  1927-12-20   DATE OF PROCEDURE:  10/06/2005  DATE OF DISCHARGE:                               OPERATIVE REPORT   PREOPERATIVE DIAGNOSIS:  Left tibia chronic osteomyelitis.   POSTOPERATIVE DIAGNOSIS:  Left tibia chronic osteomyelitis.   PROCEDURE:  Left above-the-knee amputation.   SURGEON:  Burnard Bunting, M.D.   ANESTHESIA:  General.   ESTIMATED BLOOD LOSS:  10 cc.   DRAINS:  None.   TOURNIQUET TIME:  Approximately 30 minutes at 300 mmHg.   SPECIMENS:  Tibia, to pathology.   INDICATIONS:  [name] is a 77 year old female with chronic  osteomyelitis, left leg, including the full length of the tibia, who  presents now for operative management.   PROCEDURE IN DETAIL:  The patient was brought to the operating room,  where general endotracheal anesthesia was induced.  Preoperative  antibiotics were administered.  The left leg was prepped from below the  knee to the groin with Hibiclens and saline, injected in a sterile  manner.  The left leg was elevated, but not exsanguinated.  The  tourniquet was inflated.  The fish-mouth incision was then made,  beginning across the superior aspect of the patella and extended  proximally.  The skin and subcutaneous tissues were sharply divided.  Bleeding points were encountered with Bovie electrocautery.  The maximum  length was maintained on the distal stump.  The femur was then cut with  the oscillating saw just proximal to the metaphyseal flare.  The  neurovascular bundle was identified and suture ligated x3 with silk  suture.  The sciatic nerve was palpated, mobilized distally and cut  sharply and allowed to retract back into the soft tissues.  At this  time, the tourniquet was released.  Bleeding  points encountered  were controlled using free suture ligatures and electrocautery.  The  amputation stump was thoroughly irrigated and then closed using  interrupted, inverted 0 Vicryl, 2-0 Vicryl suture and skin staples.  A  bulky dressing was placed.  The patient tolerated the procedure well  without immediate complications.      Burnard Bunting, M.D.  Electronically Signed     GSD/MEDQ  D:  10/06/2005  T:  10/06/2005  Job:  [id number]

## 2010-05-25 NOTE — Assessment & Plan Note (Signed)
[facility]   NAME:  Rebekah, Henry             ACCOUNT NO.:  [id number]   MEDICAL RECORD NO.:  [id number]      DATE OF BIRTH:  1927/08/07   PHYSICIAN:  Theresia Majors. Tanda Rockers, M.D. VISIT DATE:  08/20/2005                                     OFFICE VISIT   SUBJECTIVE:  [name] is a 77 year old lady who has undergone a complete  course of hyperbaric oxygen treatment and management of chronic  osteomyelitis of a compound tib/fib fracture of over 40 years duration.  Since the discontinuation of her hyperbaric oxygen treatment, the patient  reports that her drainage has now returned, although it is not as much as it  was prior to HBO, it certainly has returned and still has malodor.  She  represents for evaluation.   OBJECTIVE:  Blood pressure 162/78, respirations 20, pulse 68, she is  afebrile.  Examination of the wound shows that the sinuses are still  present.  There is extreme malodor and exudate in the depths of the wound on  passing of the Q-tip.  There is no evidence of abscess pointing posteriorly  or laterally.  The wound, essentially, is unchanged.   ASSESSMENT:  Refractory osteomyelitis with a non-functional left lower  extremity.   PLAN:  We have suggested to Ms. [name] that she strongly consider an  amputation.  We have indicated to her in definite terms that we do not feel  that hyperbaric oxygen is further indicated, although we were able to  decrease the drainage and control the malodor more effectively, the  radiographic pictures still suggests the presence of non-healing bone.  The  fact that her drainage is beginning to increase again suggests that there is  a residual infection.  The advantages of proceeding with an amputation  include a more immediate rehabilitation and return to independent  ambulation.  She would also not be subjected to the possibility of sepsis in  the future.  I have strongly urged her to call the orthopedist of her  choice  and make an appointment for evaluation for an amputation.  The [facility]  will no longer offer her hyperbaric oxygen as an option in the management of  this chronic wound, but rather our strong recommendation is that she proceed  expeditiously with an amputation at the level determined by the operating  surgeon.  We have indicated to Ms. [name] that we will make this note  available to the surgeon of her choice upon notification of the same by her  husband or son.  The patient was given an opportunity to ask questions.  She  seems to be satisfied with my recommendations and it appears that she is  going to proceed with having an amputation.           ______________________________  Theresia Majors Tanda Rockers, M.D.     [name]  D:  08/20/2005  T:  08/20/2005  Job:  [id number]

## 2010-05-25 NOTE — Assessment & Plan Note (Signed)
[facility]   NAME:  [name], [name]             ACCOUNT NO.:  [id number]   MEDICAL RECORD NO.:  [id number]      DATE OF BIRTH:  [birth year]   PHYSICIAN:  Theresia Majors. Tanda Rockers, M.D. VISIT DATE:  07/24/2005                                     OFFICE VISIT   SUBJECTIVE:  [name] returns for follow-up of chronic osteomyelitis.  She is undergoing concurrently HBO treatment and daily irrigations of a  heavily soiled wound on the left lower extremity.  She continues to have  malodorous drainage but it is decreasing in quantity and decreasing in  thickness.  She has had no fever.   OBJECTIVE:  Vital signs are stable.  Blood pressure 150/70, respirations 18,  pulse rate 70 and regular, she is afebrile.  Inspection of the wound shows that the chronic sinus tracts remain open.  A  Q-tip was used to probe the depths of the wound.  We did not perform any  curettage at this point.  There remains a mucopurulent exudate.  The wound  was copiously irrigated with Dakin's solution and a silver alginate dressing  strip was placed as a packing.   ASSESSMENT:  Decreased exudates and drainage consistent with improvement and  response to hyperbaric oxygen therapy.   PLAN:  We will continue HBO and reevaluate her wound in five days.           ______________________________  Theresia Majors Tanda Rockers, M.D.     [name]  D:  07/24/2005  T:  07/24/2005  Job:  [id number]

## 2010-05-25 NOTE — Assessment & Plan Note (Signed)
FOLLOW UP OFFICE NOTE   The patient is back regarding her left above-knee amputation.  It  appears she has had more pain in the right knee which has inhibited her  from participating in therapy due to pain.  She also complains of ankle  pain and foot numbness.  She has bilateral hand paresthesias, as well.  She rates her pain 10/10.  She has used oxycodone without significant  benefit.  I injected her knee while she was in the rehabilitation unit  and she did fairly well with this.  She is wearing her prosthesis on the  left side, but is not regularly it on left leg while she sits around at  home.  The patient describes her pain as sharp, burning, constant.  Pain  interferes with general activity, interaction with others, and enjoyment  of life on a moderate-to-severe level.  Pain is worse with standing and  some inactivity.   REVIEW OF SYSTEMS:  Positive for numbness, trouble walking, tingling,  plaquing, diarrhea, limb swelling and occasional sleep issues as well.  Other pertinent positives are listed above, and full reviews in the  health and history section.   PHYSICAL EXAMINATION:  VITAL SIGNS:  Blood pressure 159/79, pulse 68,  respiratory rate 16.  She is satting 97% on room air.  GENERAL:  The patient is pleasant, in no acute distress.  She is alert  and oriented times 3.  Affect is generally bright and appropriate.  She  is sitting in her wheelchair today.  RIGHT LOWER EXTREMITY:  Right knee has some mild peripatellar edema and  noted crepitus with resisted extension and flexion today.  Meniscal  signs were positive.  She had minimal pain in the right ankle and there  was pain noted with eversion of the foot along the medial ankle  compartment.  She had sensitivity along the bottom of the foot, as well  as the hands.  LEFT KNEE:  Left knee is appropriately tender.  It had some mild distal  swelling, but wounds were intact.  NEUROLOGICAL:  Cognitively she is appropriate.  HEART:  Regular rate.  CHEST: Clear.  ABDOMEN:  Soft and nontender.   ASSESSMENT:  1. Left above-knee amputation secondary to osteomyelitis.  2. Likely peripheral vascular disease.  3. Osteoarthritis of the right knee.  4. Right ankle pain.   PLAN:  The patient may benefit from nerve conduction studies in the  future.  She has had carpal tunnel syndrome in the past.  She likely has  a degree of peripheral neuropathy contributing to her distal sensory  symptoms.  We will add Neurontin low dose, titrate up to 300 mg t.i.d.  over the next 1-1/2 weeks' time.  After informed consent we injected the  right knee with 40 mg of Kenalog and 3 cc of 1% lidocaine.  The patient  tolerated it well.  Encouraged the patient to wear her prosthetic leg  while she is sitting at home to help desensitize the leg and control  swelling.  Wrote  for Percocet 5/325 for break-through pain #60.  The patient may benefit  from an Ace wrap or ASO to the right ankle to help control movement.  I  will see her back in 4-6 weeks' time.      Ranelle Oyster, M.D.  Electronically Signed     ZTS/MedQ  D:  02/03/2006 13:29:55  T:  02/03/2006 14:34:39  Job #:  [id number]   cc:   G. Dorene Grebe, M.D.  Fax: [phone]   [name]. Cliffton Asters, M.D.  Fax: [phone]

## 2010-05-25 NOTE — Assessment & Plan Note (Signed)
[facility]   NAME:  [name], [name]             ACCOUNT NO.:  [id number]   MEDICAL RECORD NO.:  [id number]      DATE OF BIRTH:  12-16-1927   PHYSICIAN:  Theresia Majors. Tanda Rockers, M.D. VISIT DATE:  07/02/2005                                     OFFICE VISIT   [name] is a 77 year old lady who has been undergoing hyperbaric  oxygen treatment for a chronic refractory osteomyelitis of the tibia of the  left lower extremity.  During the interim she has experienced decreased  drainage and decreased malodor.  She denies fever.   OBJECTIVE:  Her vital signs are stable.  She is afebrile.  Inspection of the  wound suggests the persistence of the sinus with a Q-tip used to probe the  wound.  The wound extends down into the aversion nail with areas of necrosis  and extreme malodor and suppurative changes.   A bone curette was used to debride briefly.  This maneuver was moderately  painful to the patient.  No hemorrhage was elicited.  The wound was  copiously irrigated with a Dakins solution utilizing a 60-mL syringe and a  blunt needle.  We were able to irrigate significant amounts of debris.  Thereafter the wound was irrigated with a total of 60 mL of normal saline.  Multiple cotton gauze dressings were applied to absorb runoff.  The patient  was discharged with stable vital signs.   ASSESSMENT:  Improvement concurrent with hyperbaric oxygen treatment.  There  is decreased malodor, but there is no evidence of primary bone healing as of  yet.   PLAN:  We will continue the HBO treatment.  Consider the decreased malodor a  significant change.           ______________________________  Theresia Majors. Tanda Rockers, M.D.     [name]  D:  07/02/2005  T:  07/02/2005  Job:  [id number]

## 2010-05-25 NOTE — H&P (Signed)
[name], [name]             ACCOUNT NO.:  [id number]   MEDICAL RECORD NO.:  [id number]          PATIENT TYPE:  IPS   LOCATION:  4006                         FACILITY:  [facility]   PHYSICIAN:  Ranelle Oyster, M.D.DATE OF BIRTH:  1927/05/27   DATE OF ADMISSION:  10/09/2005  DATE OF DISCHARGE:                                HISTORY & PHYSICAL   CHIEF COMPLAINT:  Left leg pain status post left above knee amputation.   HISTORY OF PRESENT ILLNESS:  This 77 year old white female with history of  left tibia fibula fracture 40 years ago, ultimately had problems with  chronic wound infections and osteomyelitis.  The patient has been followed  chronically at the wound center and has non-hyper-baric oxygen treatment in  the past.  The patient failed conservative measures and ultimately underwent  a left above amputation on 10/06/05 by Dr. August Saucer.  The patient remains on IV  vancomycin and Maxipime for treatment and prophylaxis.  The patient  continues to struggle with mobility and self care and thus it was decided to  bring her to the inpatient rehab service today.   REVIEW OF SYSTEMS:  Positive reflux, weakness and insomnia.  She has pain  still on the left residual limb.   PAST MEDICAL HISTORY:  Positive for chronic osteomyelitis, left lower  extremity, hypertension, reflux disease, irritable bowel syndrome, bilateral  carpal tunnel syndrome, hysterectomy.  She denies alcohol or tobacco abuse.   FAMILY HISTORY:  Non-contributory.   SOCIAL HISTORY:  The patient lives with her husband and son in [place].  Says son can assist her as needed at home. They have a one level home with  two steps to enter.  The patient had been ambulating with her crutches for  short distances prior to arrival.  Currently the patient is mod assist with  basic mobility and ADL's.   ALLERGIES:  Penicillin and iodine.   MEDICATIONS AT HOME:  1. Atenolol 100 mg q.d.  2. Premarin .3 mg q.d.  3. Maxzide  75/50 q.d.  4. Keflex 500 mg q.i.d.  5. Vicodin p.r.n.   LABORATORY DATA:  Hemoglobin of 9.4, white count 7.1, platelets 184,000,  sodium 142, potassium 4.5, BUN creatinine 61.0.   PHYSICAL EXAMINATION:  VITALS:  Blood pressure is 126/56, pulse 72,  respiratory rate is 20, temperature is 97.3.  GENERAL:  The patient is pleasant, in no acute distress.  HEENT:  Pupils equal, round and reactive to light and accommodation.  Extraocular eye movements are intact.  Nose and throat exam is unremarkable  with pink oral mucosa.  Dentition is fair.  NECK:  Supple with JVD, lymphadenopathy.  CHEST:  Clear to auscultation bilaterally without wheezes, rales or rhonchi.  HEART:  Regular rate and rhythm without murmur, rubs or gallops.  EXTREMITIES:  Show no clubbing, cyanosis.  She had 2+ edema on the left  wound.  Wound was generally clean and with appropriate drainage.  NEUROLOGIC:  Cranial nerve exam is intact.  Reflexes are decreased and 1+.  Sensation is decreased in distal extremities.  Judgment, orientation, memory  and mood were all fairly appropriate.  Motor function was 4/5 in the  proximal upper extremities, 4+-5/5 in the distal upper extremities today.  Left lower extremity was 3+4/5 at the hip.  Right lower extremities 5/5  throughout.   ASSESSMENT AND PLAN:  1. Functional deficit secondary to left above knee amputation due to      osteomyelitis.  2. Begin comprehensive inpatient rehab with PT to evaluate and treat for      lower extremity use, mobility and transfers. AOT, upper extremity use      and ADL's.  The patient is having some shoulder pain due to dependence      on the arms for transferring.  Will need to be observant of that.  The      24 hour rehab nursing will assess and  help to assist in the management      of bowel and bladder skin pain and medication issues.  Rehab case      manager/social worker assisted with psychosocial  needs and      disposition. ELOS  is 10  days.  Prognosis good.  Goals are modified      independent at the wheelchair level.  3. Pain management. Oxycodone IR p.r.n.  Robaxin p.r.n.  Maintain edema      control of the left lower extremity.  4. DVT prophylaxis:  Add subcu Lovenox daily.  5. Hypertension:  Tenormin.  6. Reflux disease:  Protonix.  7. Irritable bowel syndrome. Monitor habits on the rehab floor.  8. ID.  Observe off IV Vancor and Maxipime.      Ranelle Oyster, M.D.  Electronically Signed     ZTS/MEDQ  D:  10/09/2005  T:  10/10/2005  Job:  [id number]   cc:   [name]. Cliffton Asters, M.D.

## 2010-05-25 NOTE — H&P (Signed)
[name], [name]             ACCOUNT NO.:  [id number]   MEDICAL RECORD NO.:  [id number]          PATIENT TYPE:  INP   LOCATION:  0343                         FACILITY:  [facility]   PHYSICIAN:  Melissa L. Ladona Ridgel, MD  DATE OF BIRTH:  1927/10/26   DATE OF ADMISSION:  04/03/2004  DATE OF DISCHARGE:                                HISTORY & PHYSICAL   PRIMARY CARE PHYSICIAN:  [name], M.D.   CHIEF COMPLAINT:  Chest pain radiating to the right arm and right leg.   HISTORY OF PRESENT ILLNESS:  The patient is a 77 year old white female who  started with a right-sided pain under her right breast, radiating to her  right arm, down her fingers, and to her feet.  Initially in the emergency  room, she described this as chest pain, but it appears that this is actually  right upper quadrant abdominal pain.  She states that the pain started last  evening after she look her pill for her congestive heart failure.  She  states on Monday she went to her primary care physician because she needed  to have some prescriptions refilled, and asked the physician about her  shortness of breath.  She was sent to the hospital for a chest x-ray, and  found to have congestive heart failure.  An extra diuretic was prescribed.  The took that and felt much better, but then last night developed this right-  sided pain.  She states she has had no nausea or vomiting that, and no fever  or chills.  The pain comes and goes.  At the time of the interview, the  patient is pain free; however, earlier in the evening she was quite  uncomfortable with sharp intense right upper quadrant pain.  In the  emergency room, initially because the patient complained of chest  discomfort, she was treated with nitroglycerin with some relief; however,  the pain really resolved on its own after taking the nitroglycerin.  She had  several other episodes which resolved after that incident.  The patient  states she has never had this  pain before, and at this time, she is  relatively comfortable.   REVIEW OF SYSTEMS:  As stated, she had no fever, no chills, no nausea or  vomiting or diarrhea.  No melena or hematochezia.  She has a chronic leg  wound which drains.  She denies any musculoskeletal complaints.  All other  review of systems negative.   PAST MEDICAL HISTORY:  1.  Congestive heart failure.  2.  She denies diabetes.  3.  She says sometimes she had high peak systolic and end diastolic velocity      in the common.  4.  She has this chronic leg wound.   PAST SURGICAL HISTORY:  1.  She had a broken leg, which was repaired.  2.  Bilateral carpal tunnels were also operated on.   SOCIAL HISTORY:  She denies tobacco, alcohol, or illicit drug use.   FAMILY HISTORY:  Mom is decreased and dad is deceased, both of unknown  causes.   ALLERGIES:  1.  PENICILLIN.  2.  BETADINE.  3.  IODINE.   MEDICATIONS:  1.  Atenolol 100 mg daily.  2.  Maxzide 75/50 once daily.  3.  Diazepam 5 mg as needed.  4.  Premarin 0.625 daily.  5.  Keflex 500 mg q.i.d.  6.  Dicyclomine occasionally.  She says she has not needed that in a while,      up to 20 mg p.r.n.  7.  KCl 20 mEq daily.  8.  Aspirin 325 mg daily.  9.  Darvocet-N 100 as needed.   PHYSICAL EXAMINATION:  VITAL SIGNS:  Temperature is 97.3, blood pressure  121/54, pulse 69, respiratory rate 16, saturations 97%.  GENERAL:  She is in no acute distress.  She is an obese white female.  She  is normocephalic and atraumatic.  HEENT:  Pupils equal, round and reactive to light.  Extraocular muscles are  intact.  Mucous membranes are moist.  There is no JVD, no lymph nodes, and  no carotid bruits.  CHEST:  Clear to auscultation.  There is no rhonchi, rales, or wheezes.  CARDIOVASCULAR:  Regular rate and rhythm with occasional APC.  No murmurs,  rubs, or gallops.  ABDOMEN:  Soft, tender in the right upper quadrant with positive bowel  sounds and no distention.   EXTREMITIES:  Left leg with a chronic open draining wound that has a creamy  coloring to it, and a sharp odor.  NEUROLOGIC:  She is awake, alert, oriented x3.  Cranial nerves II-XII are  intact.  Power is 5/5, with the exception of the left dorsiflexion, which is  4/5.   LABORATORY DATA:  White count is 5.8, hemoglobin 12.2, hematocrit of 36.9,  platelets 220, eosinophils are elevated at 6%.  Sodium is 139, potassium  3.4, chloride 100, CO2 is 18, BUN 11, creatinine 1.2, glucose 120.  Myoglobin was elevated on the point of cares at 433, but the troponin was  negative.  BNP was 180.  PT was 12.7, INR 1.0, PTT 32.  Ultrasound is  pending.  EKG shows 70 beats per minute, normal sinus rhythm, with mild ST  depressions in V4 through V6.  There is no comparative EKG.  Chest x-ray  shows cardiomegaly with mild congestive heart failure.   ASSESSMENT AND PLAN:  This is a 77 year old white female who presents  initially with complaint of chest pain.  Further clarification reveals the  pain is actually right upper quadrant pain that radiates to the right  shoulder and down her side into her leg.  She was treated yesterday for  congestive heart failure, and said it was much better.  1.  Cardiovascular - congestive heart failure with cardiomegaly.  Will plan      to diurese her.  Plan to check serial cardiac enzymes and continue her      atenolol and aspirin.  2.  Pulmonary - congestive heart failure improved clinically.  We will      continue to diurese her, and check a 2-D echocardiogram.  3.  Gastrointestinal - right upper quadrant pain.  Her ultrasound results      are pending.  Will add Protonix, but I suspect this is gallbladder      related.  For now, will place her on clear liquids and treat her      symptomatically.  4.  Genitourinary - will check a UA C&S to rule out potential renal      contributions to this pain. 5.  Endocrine.  She denies diabetes,  has an increased CBG, so we will  check      a hemoglobin A1C.  6.  Deep vein thrombosis prophylaxis with Lovenox.  7.  Chronic leg wound.  Will continue her Keflex, culture the wound, and ask      for a wound care consult.      MLT/MEDQ  D:  04/04/2004  T:  04/04/2004  Job:  [id number]   cc:   [name], M.D.  [address]  [place]  [place] [address]  Fax: [phone]

## 2010-05-25 NOTE — Op Note (Signed)
   NAMEJOYIA, [name]                       ACCOUNT NO.:  [id number]   MEDICAL RECORD NO.:  [id number]                   PATIENT TYPE:  AMB   LOCATION:  DSC                                  FACILITY:  [facility]   PHYSICIAN:  Lowell Bouton, M.D.      DATE OF BIRTH:  Mar 20, 1927   DATE OF PROCEDURE:  05/19/2002  DATE OF DISCHARGE:                                 OPERATIVE REPORT   PREOPERATIVE DIAGNOSIS:  Left carpal tunnel syndrome.   POSTOPERATIVE DIAGNOSIS:  Left carpal tunnel syndrome.   OPERATION:  Decompression median nerve left carpal tunnel.   SURGEON:  Lowell Bouton, M.D.   ANESTHESIA:  0.5% Marcaine local with sedation.   OPERATIVE FINDINGS:  The patient had significant pressure on her median  nerve.  There were no masses in the carpal canal and the motor branch of the  nerve was intact.   DESCRIPTION OF PROCEDURE:  Under 0.5% Marcaine local anesthesia with a  tourniquet on the left arm, the left hand was prepped and draped in the  usual fashion and after exsanguinating limb, the tourniquet was inflated to  250 mmHg.  A 3 cm longitudinal incision was made in the palm just ulnar to  the thenar crease.  Sharp dissection was carried through the subcutaneous  tissues and bleeding points were coagulated.  Blunt dissection was carried  through the superficial palmar fascia distal to the transverse carpal  ligament.  A Hemostat was then placed in the carpal canal up against the  hook of the hamate.  The transverse carpal ligament was then divided on the  ulnar border of the median nerve with a knife.  The proximal end of the  ligament was divided with the scissors after dissecting the nerve away from  the undersurface of the ligament.  The carpal canal was then palpated and  was found to be adequately decompressed.  The nerve was examined and the  motor branch identified.  The wound was irrigated copiously with saline.  The skin was closed with 4-0 Nylon  suture.  Sterile dressings were applied.  Followed by a volar wrist splint.  The patient tolerated the procedure well  and went to the recovery room awake and stable in good condition.                                               Lowell Bouton, M.D.    EMM/MEDQ  D:  05/19/2002  T:  05/20/2002  Job:  [id number]

## 2010-05-25 NOTE — Op Note (Signed)
   [name], [name]                       ACCOUNT NO.:  [id number]   MEDICAL RECORD NO.:  [id number]                   PATIENT TYPE:  AMB   LOCATION:  DSC                                  FACILITY:  [facility]   PHYSICIAN:  Lowell Bouton, M.D.      DATE OF BIRTH:  1927/06/15   DATE OF PROCEDURE:  07/21/2002  DATE OF DISCHARGE:                                 OPERATIVE REPORT   PREOPERATIVE DIAGNOSIS:  Right carpal tunnel syndrome.   POSTOPERATIVE DIAGNOSIS:  Right carpal tunnel syndrome.   PROCEDURE:  Decompression of median nerve right carpal tunnel.   SURGEON:  Lowell Bouton, M.D.   ANESTHESIA:  0.5% Marcaine local with sedation.   FINDINGS:  The patient had no masses in the carpal canal.  The median nerve  did show signs of compression. The motor branch of the nerve was intact.   DESCRIPTION OF PROCEDURE:  Under 0.5% Marcaine local anesthesia with a  tourniquet on the right arm, the right hand was prepped and draped in the  usual fashion.  After exsanguinating the limb, the tourniquet was inflated  to 250 mmHg.  A 3 cm longitudinal incision was made in the palm just ulnar  to the thenar crease. Sharp dissection was carried through the subcutaneous  tissues.  Blunt dissection was carried through the superficial palmar fascia  distal to the transverse carpal ligament. A Hemostat was then placed in the  carpal canal up against the hook of hamate and the transverse carpal  ligament was divided on the ulnar border of the median nerve.  The proximal  end of the ligament was divided with the scissors after dissecting the nerve  away from the undersurface of the ligament. The carpal canal was then  palpated and was found to be adequately decompressed.  The nerve was  examined and the motor branch identified. The wound was irrigated with  saline. The skin was closed with 4-0 nylon sutures.  A sterile dressing was  applied followed by a volar wrist splint. The  patient tolerated the  procedure well and went to the recovery room awake, stable, and in good  condition.                                               Lowell Bouton, M.D.    EMM/MEDQ  D:  07/21/2002  T:  07/21/2002  Job:  [id number]

## 2010-05-25 NOTE — Assessment & Plan Note (Signed)
[name] is back regarding her left above-knee amputation.  She continues  to complain of pain in her right foot and hands.  She is not taking the  Neurontin consistently.  She rates her pain a 10/10.  She also feels  that she has arthritis in her hands and feet.  Her knee is doing better  since the injection.  She has not been regular with the use of her  orthosis.  She has been in therapy intermittently.  She is afraid to try  steps.  She often does not wear the leg when she is just sitting around  the house.   REVIEW OF SYSTEMS:  The patient reports occasional spasms, tremor,  numbness, tingling, trouble walking and weight gain.  She has had some  intermittent limb swelling, particularly in the right lower extremity.  Full review is in the health and history section.   SOCIAL HISTORY:  The patient is without change.  Her son is supportive  and with her today.   PHYSICAL EXAMINATION:  VITAL SIGNS:  Blood pressure is 140/53, pulse is  68, respiratory rate 16, she is satting 98% on room air.  GENERAL:  The patient is pleasant and in no acute distress.  She is  alert and oriented times 3.  Affect is generally bright and appropriate.  Right knee is improved with range of motion and pain tolerance.  The  right foot is notable for distal sensory loss.  Strength seems to be  generally preserved.  Left leg was intact and appropriately tender.  She  had minimal swelling today.  Both hands were notable for mild arthritic  changes.  She had distal sensory loss in the upper extremities.  Sensory  exam was 1/2.  Tinel's test were equivocal at the wrists.  HEART:  Regular.  CHEST:  Clear.  ABDOMEN:  Soft, nontender.   ASSESSMENT:  1. Left above-knee amputation.  2. Peripheral vascular disease and likely peripheral neuropathy.  The      patient does have a history of carpal tunnel syndrome bilaterally.  3. Osteoarthritis of the right knee.   PLAN:  1. The patient needs to take her Neurontin as  ordered.  Resume      Neurontin at 300 mg t.i.d. and after 2 weeks increase the nighttime      dose to 600 mg.  2. Consider nerve conduction studies.  3. The patient was encouraged to wear her prosthetic unit more often.      She needs to get involved with therapy and get up on the leg to      build her confidence.  4. The patient will continue with Percocet for more severe break-      through pain.  5. I will see the patient back in about 2 months' time.  She is to      call me back with any problems in the meantime.      Ranelle Oyster, M.D.  Electronically Signed     ZTS/MedQ  D:  03/03/2006 11:47:17  T:  03/03/2006 12:33:19  Job #:  [id number]   cc:   G. Dorene Grebe, M.D.  Fax: [phone]   [name]. Cliffton Asters, M.D.  Fax: [phone]

## 2010-05-25 NOTE — Discharge Summary (Signed)
[name], TLATELPA             ACCOUNT NO.:  [id number]   MEDICAL RECORD NO.:  [id number]          PATIENT TYPE:  INP   LOCATION:  0343                         [facility]ACILITY[facility]   PHYSICIAN:  Corinna L. Lendell Caprice, MDDATE OF BIRTH:  1927/09/08   DATE OF ADMISSION:  04/03/2004  DATE OF DISCHARGE:                                 DISCHARGE SUMMARY   DIAGNOSES:  1.  Migrating pain, possibly secondary to hypothyroidism.  2.  Recent diagnosis of congestive heart failure with dyspnea, possibly      secondary to hypothyroidism versus diastolic dysfunction, ejection      fraction normal.  3.  Probable hypothyroidism.  4.  Chronic left leg osteomyelitis, has repeatedly refused amputation.  5.  Hypertension.   DISCHARGE MEDICATIONS:  Synthroid 25 mcg p.o. daily.   She is to continue all of her other outpatient medications which include:  1.  Keflex chronically.  2.  Atenolol 100 mg a day.  3.  Maxzide 75/50 daily.  4.  Valium as needed.  5.  Premarin 0.625 mg daily.  6.  Potassium 20 mEq daily.  7.  Aspirin 325 mg daily.  8.  Darvocet as needed.   DIET:  She will be low salt.   ACTIVITY:  Ad lib.   FOLLOW-UP:  With Dr. Laurann Montana in 2-4 weeks.   CONDITION:  Stable.   CONSULTATIONS:  None.   PROCEDURES:  None.   PERTINENT LABORATORY:  UA showed moderate leukocyte esterase, few epithelial  cells, 0-2 white cells, many bacteria. Urine culture negative. Wound culture  of the leg showed moderate gram positive cocci in pairs, moderate gram  negative rods. Myoglobin on admission was 500, CPK-MB was 3.3, troponin  negative x3. TSH on March 29 was 6.018. When repeated, the TSH was 1.907.  Free T4 1.27, free T3 2.2 - which is low. CBC unremarkable. Complete  metabolic panel significant for a potassium of 3.4, albumin of 3.1;  otherwise unremarkable. Total CPK was 228. Hemoglobin A1c was 5.6. Special  studies and radiology:  Sinus bradycardia with rate of 56, nonspecific  changes.  Ultrasound of the abdomen showed fatty liver, cholelithiasis, no  evidence of cholecystitis. It was very limited due to body habitus. Chest x-  ray showed cardiomegaly and pulmonary vascular congestion.   HISTORY AND HOSPITAL COURSE:  [name] is a 77 year old black female  patient of Dr. Laurann Montana who presented to the emergency room with chest  pain, right arm pain, right leg pain. We were consulted to admit for chest  pain. Upon further questioning it became clear that the patient's pain had a  migrating pattern - sometimes in the feet, sometimes in the legs, sometimes  in the abdomen, sometimes in the chest, etc. It was not felt to be cardiac  in nature. However, MI was ruled out by cardiac enzymes. She was monitored  on telemetry for 24 hours. The patient had no signs of congestive heart  failure on admission. Please see H&P for details. However, she had recently  been started on a diuretic for shortness of breath. An echocardiogram had  not been  done yet. This was ordered and showed an ejection fraction of 55-  65% without any wall motion abnormalities, mild mitral regurgitation,  moderately dilated left atrium. The patient's home medications were  continued. She also was started on an antibiotic for her urinary tract  infection. The patient has a chronically draining sore on her left pretibial  area due to chronic osteomyelitis. It had very foul smell to it. I discussed  the case with both Dr. Laurann Montana and Dr. Lajoyce Corners. Dr. Lajoyce Corners reports that the  patient has had this chronic osteomyelitis for several decades and has  refused amputation in the past. She takes Keflex chronically for this. He  did recommend that if the odor was foul to give a few days' worth of Zosyn,  which I have done. Reportedly, however, it does usually have quite a bit of  drainage and foul smell, so this may not be changed from baseline. The  patient's TSH was noted to be elevated and when it was repeated  the next day  it was in normal range but her free T3 was low. 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 I have  elected to start her on a very small dose of Synthroid. She will need repeat  thyroid function tests eventually. I consulted physical therapy. The patient  uses crutches at home and she did quite well with this.      CLS/MEDQ  D:  04/06/2004  T:  04/06/2004  Job:  [id number]   cc:   [name], M.D.  [address]  [place]  [place] [address]  Fax: [phone]   Nadara Mustard, MD  [address] [place]  [place] [address]  Fax: [phone]

## 2010-05-25 NOTE — Assessment & Plan Note (Signed)
[facility]   NAME:  URI, COVEY             ACCOUNT NO.:  [id number]   MEDICAL RECORD NO.:  [id number]          DATE OF BIRTH:   PHYSICIAN:  Jonelle Sports. Sevier, M.D.  VISIT DATE:  06/27/2005                                     OFFICE VISIT   VITAL SIGNS:  Blood pressure 136/70, heart rate 78 regular, respirations 18,  temperature 97.5.   PURPOSE OF TODAY'S VISIT:  This 77 year old black female is undergoing  hyperbaric oxygen therapy having today completed her seventh treatment, with  this assessment done immediately following such treatment.   Her problem is that of severe osteomyelitis involving the mid tibial and mid  fibula portions of the left lower extremity with large wound cavity and with  pseudoarthrosis of the bones there.  She is on chronic antibiotic therapy  and has had periodic removal of sequestra by the orthopedist.  Hyperbaric  oxygen therapy is now being given as an adjunct to these other treatments.   The patient reports that since the beginning of these treatments she is able  to tolerate more weightbearing on the extremity and otherwise has less pain  and discomfort than before.  Her drainage, however, has not decreased either  in volume or in degree of foul odor.   She has noted after previous debridement some blood clots earlier in the  week but those have disappeared now.   WOUND EXAM:  The wound is seen as 2 superficial openings measuring 1.5 x 0.9  cm and 0.9 x 0.6 cm with a depth of the wound on probing approximately 3.5  to 4 cm.   There is chronic gross purulent drainage from the wound which is extremely  malodorous and which is brownish green in color.   WOUND SINCE LAST VISIT:   CHANGE IN INTERVAL MEDICAL HISTORY:   DIAGNOSIS:  Stable wound in the face of chronic osteomyelitis of left tibia  and fibula, possibly improved in that the patient is less symptomatic.   TREATMENT:  The wound is irrigated initially with  saline and then with  hydrochloride solution and then with saline again.  This indeed flushes out  additional stained purulent discharge and a few tiny no more than 1 or 2 mm  blood clots.  There is no fresh bleeding.  The wound is probed but no  additional debridement appears indicated at this point in time.   ANESTHETIC USED:   TISSUE DEBRIDED:   LEVEL:   CHANGE IN MEDS:   COMPRESSION BANDAGE:   OTHER:   MANAGEMENT PLAN & GOAL:  The plan will be to continue her antibiotic therapy  and continue hyperbaric oxygen therapy with her next treatment to be on June  22.           ______________________________  Jonelle Sports. Cheryll Cockayne, M.D.     RES/MEDQ  D:  06/27/2005  T:  06/27/2005  Job:  [id number]

## 2010-06-26 ENCOUNTER — Inpatient Hospital Stay (HOSPITAL_COMMUNITY)
Admission: EM | Admit: 2010-06-26 | Discharge: 2010-06-29 | DRG: 689 | Disposition: A | Discharge status: Skilled Nursing Facility | Payer: Medicare Other | Attending: Family Medicine | Admitting: Family Medicine

## 2010-06-26 ENCOUNTER — Emergency Department (HOSPITAL_COMMUNITY): Payer: Medicare Other

## 2010-06-26 DIAGNOSIS — D649 Anemia, unspecified: Secondary | ICD-10-CM | POA: Diagnosis present

## 2010-06-26 DIAGNOSIS — I129 Hypertensive chronic kidney disease with stage 1 through stage 4 chronic kidney disease, or unspecified chronic kidney disease: Secondary | ICD-10-CM | POA: Diagnosis present

## 2010-06-26 DIAGNOSIS — N390 Urinary tract infection, site not specified: Principal | ICD-10-CM | POA: Diagnosis present

## 2010-06-26 DIAGNOSIS — E860 Dehydration: Secondary | ICD-10-CM | POA: Diagnosis present

## 2010-06-26 DIAGNOSIS — S78119A Complete traumatic amputation at level between unspecified hip and knee, initial encounter: Secondary | ICD-10-CM

## 2010-06-26 DIAGNOSIS — L89109 Pressure ulcer of unspecified part of back, unspecified stage: Secondary | ICD-10-CM | POA: Diagnosis present

## 2010-06-26 DIAGNOSIS — N183 Chronic kidney disease, stage 3 unspecified: Secondary | ICD-10-CM | POA: Diagnosis present

## 2010-06-26 DIAGNOSIS — I69959 Hemiplegia and hemiparesis following unspecified cerebrovascular disease affecting unspecified side: Secondary | ICD-10-CM

## 2010-06-26 DIAGNOSIS — Z7902 Long term (current) use of antithrombotics/antiplatelets: Secondary | ICD-10-CM

## 2010-06-26 DIAGNOSIS — Z7401 Bed confinement status: Secondary | ICD-10-CM

## 2010-06-26 DIAGNOSIS — I5032 Chronic diastolic (congestive) heart failure: Secondary | ICD-10-CM | POA: Diagnosis present

## 2010-06-26 DIAGNOSIS — R627 Adult failure to thrive: Secondary | ICD-10-CM | POA: Diagnosis present

## 2010-06-26 DIAGNOSIS — E876 Hypokalemia: Secondary | ICD-10-CM | POA: Diagnosis not present

## 2010-06-26 DIAGNOSIS — D72829 Elevated white blood cell count, unspecified: Secondary | ICD-10-CM | POA: Diagnosis present

## 2010-06-26 DIAGNOSIS — N179 Acute kidney failure, unspecified: Secondary | ICD-10-CM | POA: Diagnosis present

## 2010-06-26 DIAGNOSIS — Z79899 Other long term (current) drug therapy: Secondary | ICD-10-CM

## 2010-06-26 DIAGNOSIS — E785 Hyperlipidemia, unspecified: Secondary | ICD-10-CM | POA: Diagnosis present

## 2010-06-26 DIAGNOSIS — I4891 Unspecified atrial fibrillation: Secondary | ICD-10-CM | POA: Diagnosis present

## 2010-06-26 DIAGNOSIS — A498 Other bacterial infections of unspecified site: Secondary | ICD-10-CM | POA: Diagnosis present

## 2010-06-26 DIAGNOSIS — L8993 Pressure ulcer of unspecified site, stage 3: Secondary | ICD-10-CM | POA: Diagnosis present

## 2010-06-26 LAB — DIFFERENTIAL
Basophils Absolute: 0.1 10*3/uL (ref 0.0–0.1)
Eosinophils Absolute: 0.3 10*3/uL (ref 0.0–0.7)
Lymphocytes Relative: 10 % — ABNORMAL LOW (ref 12–46)
Lymphs Abs: 1.8 10*3/uL (ref 0.7–4.0)
Neutrophils Relative %: 81 % — ABNORMAL HIGH (ref 43–77)

## 2010-06-26 LAB — CBC
HCT: 31 % — ABNORMAL LOW (ref 36.0–46.0)
Hemoglobin: 10 g/dL — ABNORMAL LOW (ref 12.0–15.0)
MCH: 22.7 pg — ABNORMAL LOW (ref 26.0–34.0)
MCV: 70.3 fL — ABNORMAL LOW (ref 78.0–100.0)
RBC: 4.41 MIL/uL (ref 3.87–5.11)
WBC: 18.6 10*3/uL — ABNORMAL HIGH (ref 4.0–10.5)

## 2010-06-26 LAB — URINALYSIS, ROUTINE W REFLEX MICROSCOPIC
Bilirubin Urine: NEGATIVE
Nitrite: POSITIVE — AB
Specific Gravity, Urine: 1.019 (ref 1.005–1.030)
Urobilinogen, UA: 1 mg/dL (ref 0.0–1.0)
pH: 5.5 (ref 5.0–8.0)

## 2010-06-26 LAB — POCT I-STAT, CHEM 8
Chloride: 109 mEq/L (ref 96–112)
HCT: 34 % — ABNORMAL LOW (ref 36.0–46.0)
Hemoglobin: 11.6 g/dL — ABNORMAL LOW (ref 12.0–15.0)
Potassium: 3.9 mEq/L (ref 3.5–5.1)

## 2010-06-26 LAB — TROPONIN I: Troponin I: <0.3 ng/mL (ref <0.30)

## 2010-06-26 LAB — URINE MICROSCOPIC-ADD ON

## 2010-06-26 LAB — CK TOTAL AND CKMB (NOT AT ARMC): Relative Index: 1.1 (ref 0.0–2.5)

## 2010-06-27 LAB — CBC
HCT: 24.9 % — ABNORMAL LOW (ref 36.0–46.0)
MCH: 22.7 pg — ABNORMAL LOW (ref 26.0–34.0)
MCV: 69.7 fL — ABNORMAL LOW (ref 78.0–100.0)
RDW: 17.7 % — ABNORMAL HIGH (ref 11.5–15.5)
WBC: 14.1 10*3/uL — ABNORMAL HIGH (ref 4.0–10.5)

## 2010-06-27 LAB — RENAL FUNCTION PANEL
Albumin: 1.7 g/dL — ABNORMAL LOW (ref 3.5–5.2)
BUN: 28 mg/dL — ABNORMAL HIGH (ref 6–23)
CO2: 20 mEq/L (ref 19–32)
Calcium: 9.7 mg/dL (ref 8.4–10.5)
Chloride: 105 mEq/L (ref 96–112)
Creatinine, Ser: 1.45 mg/dL — ABNORMAL HIGH (ref 0.50–1.10)

## 2010-06-28 DIAGNOSIS — F4320 Adjustment disorder, unspecified: Secondary | ICD-10-CM

## 2010-06-28 LAB — BASIC METABOLIC PANEL
Calcium: 9.7 mg/dL (ref 8.4–10.5)
GFR calc Af Amer: 48 mL/min — ABNORMAL LOW (ref >60)
GFR calc non Af Amer: 40 mL/min — ABNORMAL LOW (ref >60)
Sodium: 138 mEq/L (ref 135–145)

## 2010-06-28 LAB — CBC
MCH: 22.9 pg — ABNORMAL LOW (ref 26.0–34.0)
MCHC: 32.9 g/dL (ref 30.0–36.0)
Platelets: 268 10*3/uL (ref 150–400)

## 2010-06-28 LAB — URINE CULTURE: Culture  Setup Time: 201206192212

## 2010-06-29 DIAGNOSIS — F4320 Adjustment disorder, unspecified: Secondary | ICD-10-CM

## 2010-06-29 LAB — BASIC METABOLIC PANEL
CO2: 20 mEq/L (ref 19–32)
GFR calc non Af Amer: 38 mL/min — ABNORMAL LOW (ref >60)
Glucose, Bld: 102 mg/dL — ABNORMAL HIGH (ref 70–99)
Potassium: 4 mEq/L (ref 3.5–5.1)
Sodium: 138 mEq/L (ref 135–145)

## 2010-06-29 LAB — CBC
Hemoglobin: 7.8 g/dL — ABNORMAL LOW (ref 12.0–15.0)
MCHC: 32.2 g/dL (ref 30.0–36.0)
RBC: 3.46 MIL/uL — ABNORMAL LOW (ref 3.87–5.11)
WBC: 9.9 10*3/uL (ref 4.0–10.5)
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
## 2010-07-01 NOTE — Discharge Summary (Signed)
[name], [name]             ACCOUNT NO.:  [id number]  MEDICAL RECORD NO.:  [id number]  LOCATION:  5152                         FACILITY:  [facility]  PHYSICIAN:  Standley Dakins, MD   DATE OF BIRTH:  [birth year]  DATE OF ADMISSION:  06/26/2010 DATE OF DISCHARGE:                        DISCHARGE SUMMARY - REFERRING   DISCHARGE DIAGNOSES: 1. Urinary tract infection -- E coli -- multidrug resistant, sensitive     to cefazolin and cephalexin. 2. Hypokalemia -- replenished. 3. Chronic anemia. 4. Dyslipidemia. 5. Rate controlled atrial fibrillation. 6. Sacral decubitus wound. 7. Failure to thrive. 8. Cerebrovascular disease. 9. Diminished capacity to make medical decisions.  DISCHARGE MEDICATIONS:  Please see discharge medication reconciliation form.  HOSPITAL COURSE:  Briefly, this patient is an 77 year old female with cerebrovascular disease who was brought into the hospital after she was found down at home in her own feces and waste.  She has apparently been discharged home from the [facility] for rehab and had been home for just over a week, but apparently did not have proper 24 hours supervision.  The patient had been left home multiple times alone and not cared for appropriately.  The patient was found to have urinary tract infection, mild dehydration and was admitted into the hospital for further care and management and also her home situation was investigated as well.  The patient was seen by Psychiatry who understood and reported that the patient was not fully competent to make her own medical decisions.    The psychiatrist said that the patient did not have full capacity to decide about skilled nursing placement.  The patient was seen by the nutritionist who recommended that the patient have Ensure twice daily for nutrition.  The patient was started on cephalexin p.o. to treat the urinary tract infection.  Her electrolyte abnormalities were  treated.    The patient was also treated by the wound care team and seen by occupational therapy as well.  Rehab skilled nursing bed was found for the patient and the patient was discharged to return to a skilled nursing facility because we believed that this is her best chance of having a meaningful recovery and safety.  We met with the family members who also agreed that skilled nursing placement was more appropriate for this patient.  DISCHARGE CONDITION:  Stable at baseline.  DISPOSITION:  The patient will be discharged to a skilled nursing facility for further care and treatment.  Activity as tolerated.  I recommend physical therapy to continue when she is transferred to the skilled nursing facility.  Diet recommended a cardiac diet and also recommended that the patient consume a chocolate Ensure clinical strength b.i.d. and encourage p.o.  Also encouraged free fluids.  In addition, I have recommended the patient have a repeat urinalysis done in approximately 1 week.  FOLLOWUP:  The patient should follow up with her primary care provider in approximately 1 to 2 weeks for recheck.  I have also kept the Foley catheter in place at this time because she does have a sacral decubitus that needs to be monitored closely.  Also, I have recommended that the patient have a repeat metabolic panel in approximately 2 weeks as  well.  SPECIAL INSTRUCTIONS:  Return if symptoms recur, worsen or if new changes develop.  Repeat urinalysis in 1 week.  Foley catheter has been maintained like a Wound Care consult for the patient's sacral decubitus to be evaluated and treated.  Frequent turns in the bed recommended and I would like the patient out of bed in a chair at least 3 to 4 times a day.  I spent 35 minutes preparing this patient's discharge including reviewing her extensive medical records, arranging transportation and follow up and dictating a discharge summary and orders.    The patient  was examined on the day of discharge on June 29, 2010 and the patient was comfortable, in no distress, eating well.  PHYSICAL EXAMINATION:  VITAL SIGNS:  Reviewed, temperature 98.2, pulse 78, respirations 19, blood pressure 131/77, pulse ox 99% on room air. GENERAL:  Well-developed, well-nourished female, awake, alert, in no distress, cooperative and pleasant. HEENT:  Normocephalic, atraumatic.  Mucous membranes moist and wet. NECK:  Supple.  No lymphadenopathy or JVD.  Thyroid soft.  No nodules or masses palpated. LUNGS:  Bilateral breath sounds, clear to auscultation.  No crackles, wheezes or rhonchi. CARDIAC:  Normal S1, S2 sounds without murmurs, rubs or gallops. ABDOMEN:  Soft, nondistended, nontender.  No masses palpated. EXTREMITIES:  The patient has a well-healed left AKA stump and no pretibial edema noted in the right lower extremity.  Fall precautions strongly recommended.     Standley Dakins, MD     CJ/MEDQ  D:  06/29/2010  T:  06/29/2010  Job:  [id number]  Electronically Signed by Standley Dakins  on 07/01/2010 05:10:11 PM

## 2010-07-03 LAB — CULTURE, BLOOD (ROUTINE X 2): Culture: NO GROWTH

## 2010-10-22 LAB — CBC
HCT: 35.2 — ABNORMAL LOW
Hemoglobin: 11.6 — ABNORMAL LOW
MCHC: 33.1
MCHC: 33.7
MCV: 78.2
MCV: 79.6
Platelets: 190
Platelets: 226
RBC: 4.42
RDW: 14.1 — ABNORMAL HIGH
RDW: 14.5 — ABNORMAL HIGH
WBC: 11.2 — ABNORMAL HIGH
WBC: 7.2

## 2010-10-22 LAB — CULTURE, BLOOD (ROUTINE X 2)
Culture: NO GROWTH
Culture: NO GROWTH

## 2010-10-22 LAB — HEPATIC FUNCTION PANEL
ALT: 32
AST: 52 — ABNORMAL HIGH
Alkaline Phosphatase: 123 — ABNORMAL HIGH
Bilirubin, Direct: 0.3

## 2010-10-22 LAB — MAGNESIUM: Magnesium: 2

## 2010-10-22 LAB — URINE MICROSCOPIC-ADD ON

## 2010-10-22 LAB — COMPREHENSIVE METABOLIC PANEL
AST: 66 — ABNORMAL HIGH
Albumin: 3.3 — ABNORMAL LOW
Calcium: 9.3
Creatinine, Ser: 1.27 — ABNORMAL HIGH
GFR calc Af Amer: 50 — ABNORMAL LOW
Total Protein: 7.6

## 2010-10-22 LAB — URINALYSIS, ROUTINE W REFLEX MICROSCOPIC
Nitrite: POSITIVE — AB
Specific Gravity, Urine: 1.012
Urobilinogen, UA: 1

## 2010-10-22 LAB — URINE CULTURE: Colony Count: >=100000

## 2010-10-22 LAB — DIFFERENTIAL
Eosinophils Relative: 1
Lymphocytes Relative: 16
Lymphs Abs: 1.8
Monocytes Absolute: 0.9 — ABNORMAL HIGH

## 2010-10-22 LAB — BASIC METABOLIC PANEL
BUN: 11
CO2: 28
Calcium: 9.3
Chloride: 103
Creatinine, Ser: 1.24 — ABNORMAL HIGH
GFR calc Af Amer: 51 — ABNORMAL LOW
GFR calc non Af Amer: 42 — ABNORMAL LOW
Glucose, Bld: 100 — ABNORMAL HIGH
Potassium: 3.4 — ABNORMAL LOW
Sodium: 140

## 2010-10-22 LAB — CA 125: CA 125: 12

## 2010-10-22 LAB — PHOSPHORUS: Phosphorus: 2.8

## 2010-10-22 LAB — CEA: CEA: <0.5

## 2010-11-01 ENCOUNTER — Emergency Department (HOSPITAL_COMMUNITY): Payer: Medicare Other

## 2010-11-01 ENCOUNTER — Emergency Department (HOSPITAL_COMMUNITY)
Admission: EM | Admit: 2010-11-01 | Discharge: 2010-11-01 | Disposition: A | Discharge status: Home / Self Care | Payer: Medicare Other | Attending: Emergency Medicine | Admitting: Emergency Medicine

## 2010-11-01 DIAGNOSIS — S78119A Complete traumatic amputation at level between unspecified hip and knee, initial encounter: Secondary | ICD-10-CM | POA: Insufficient documentation

## 2010-11-01 DIAGNOSIS — I739 Peripheral vascular disease, unspecified: Secondary | ICD-10-CM | POA: Insufficient documentation

## 2010-11-01 DIAGNOSIS — M79609 Pain in unspecified limb: Secondary | ICD-10-CM | POA: Insufficient documentation

## 2010-11-01 DIAGNOSIS — Y9210 Unspecified residential institution as the place of occurrence of the external cause: Secondary | ICD-10-CM | POA: Insufficient documentation

## 2010-11-01 DIAGNOSIS — Z79899 Other long term (current) drug therapy: Secondary | ICD-10-CM | POA: Insufficient documentation

## 2010-11-01 DIAGNOSIS — IMO0002 Reserved for concepts with insufficient information to code with codable children: Secondary | ICD-10-CM | POA: Insufficient documentation

## 2010-11-01 DIAGNOSIS — S91109A Unspecified open wound of unspecified toe(s) without damage to nail, initial encounter: Secondary | ICD-10-CM | POA: Insufficient documentation

## 2010-11-01 DIAGNOSIS — I10 Essential (primary) hypertension: Secondary | ICD-10-CM | POA: Insufficient documentation

## 2010-11-01 DIAGNOSIS — S92919A Unspecified fracture of unspecified toe(s), initial encounter for closed fracture: Secondary | ICD-10-CM | POA: Insufficient documentation

## 2010-11-12 ENCOUNTER — Encounter: Payer: Self-pay | Admitting: *Deleted

## 2010-11-12 ENCOUNTER — Emergency Department (HOSPITAL_COMMUNITY)
Admission: EM | Admit: 2010-11-12 | Discharge: 2010-11-12 | Disposition: A | Discharge status: Skilled Nursing Facility | Payer: PRIVATE HEALTH INSURANCE | Attending: Emergency Medicine | Admitting: Emergency Medicine

## 2010-11-12 DIAGNOSIS — Z4802 Encounter for removal of sutures: Secondary | ICD-10-CM

## 2010-11-12 DIAGNOSIS — R0602 Shortness of breath: Secondary | ICD-10-CM | POA: Insufficient documentation

## 2010-11-12 HISTORY — DX: Chronic kidney disease, stage 3 (moderate): N18.3

## 2010-11-12 HISTORY — DX: Acquired absence of unspecified leg below knee: Z89.519

## 2010-11-12 HISTORY — DX: Essential (primary) hypertension: I10

## 2010-11-12 HISTORY — DX: Anemia, unspecified: D64.9

## 2010-11-12 HISTORY — DX: Calculus of kidney: N20.0

## 2010-11-12 HISTORY — DX: Acute pancreatitis without necrosis or infection, unspecified: K85.90

## 2010-11-12 HISTORY — DX: Unspecified atrial fibrillation: I48.91

## 2010-11-12 HISTORY — DX: Chronic kidney disease, stage 3 unspecified: N18.30

## 2010-11-12 HISTORY — DX: Urinary tract infection, site not specified: N39.0

## 2010-11-12 HISTORY — DX: Cerebral infarction, unspecified: I63.9

## 2010-11-12 NOTE — ED Notes (Signed)
ZOX:[id number]<AV> Expected date:11/12/10<BR> Expected time:11:38 AM<BR> Means of arrival:Ambulance<BR> Comments:<BR> 77yo need sutures removed

## 2010-11-12 NOTE — ED Notes (Signed)
Secretary to call [facility] for transportation back to [facility]

## 2010-11-12 NOTE — ED Notes (Signed)
Pt with sutures to left greater toe placed on the 25th, no signs of infection noted. Pt denies any pain. Cap refill <2. Pt reports her toe got caught while she was being helped transfer from bed.

## 2010-11-12 NOTE — ED Notes (Signed)
[facility] at bedside, all paperwork given to ptar

## 2010-11-12 NOTE — ED Notes (Signed)
Pt is from [facility]. Pt sent over for removal of sutures to right foot, placed on the 25th of October.

## 2010-11-12 NOTE — ED Notes (Signed)
Sutures removed by PA

## 2010-11-12 NOTE — ED Provider Notes (Signed)
Medical screening examination/treatment/procedure(s) were performed by non-physician practitioner and as supervising physician I was immediately available for consultation/collaboration.   Lyanne Co, MD 11/12/10 [phone]

## 2010-11-12 NOTE — ED Provider Notes (Signed)
History     CSN: [id number] Arrival date & time: 11/12/2010 11:52 AM   First MD Initiated Contact with Patient 11/12/10 1203      Chief Complaint  Patient presents with  . Suture / Staple Removal    (Consider location/radiation/quality/duration/timing/severity/associated sxs/prior treatment) Patient is a 77 y.o. female presenting with suture removal. The history is provided by the patient, the nursing home and the EMS personnel.  Suture / Staple Removal  The sutures were placed 11 to 14 days ago. Treatments since wound repair include regular soap and water washings. Fever duration: no fever. There has been no drainage from the wound. There is no redness present. There is no swelling present. The pain has no pain. She has no difficulty moving the affected extremity or digit.    Past Medical History  Diagnosis Date  . CVA (cerebral infarction)   . Diastolic heart failure   . Anemia   . Atrial fibrillation   . Acute on chronic kidney disease, stage 3   . Hypertension   . Hx of BKA   . Kidney stones   . Pancreatitis   . Urinary tract infection     Past Surgical History  Procedure Date  . Below knee leg amputation     History reviewed. No pertinent family history.  History  Substance Use Topics  . Smoking status: Never Smoker   . Smokeless tobacco: Never Used  . Alcohol Use: No     Review of Systems  Constitutional: Negative for fever and chills.  Respiratory: Positive for shortness of breath. Negative for cough.   Cardiovascular: Negative for chest pain and leg swelling.  Gastrointestinal: Negative for nausea, vomiting and abdominal pain.  Musculoskeletal: Negative for back pain and joint swelling.  Skin: Positive for wound. Negative for color change and rash.  Neurological: Negative for weakness and numbness.    Allergies  Iodinated diagnostic agents and Penicillins  Home Medications   Current Outpatient Rx  Name Route Sig Dispense Refill  . AMLODIPINE  BESYLATE 10 MG PO TABS Oral Take 10 mg by mouth daily.      Marland Kitchen CARVEDILOL 3.125 MG PO TABS Oral Take 3.125 mg by mouth 2 (two) times daily with a meal.      . CITALOPRAM HYDROBROMIDE 10 MG PO TABS Oral Take 10 mg by mouth daily.      Marland Kitchen CLOPIDOGREL BISULFATE 75 MG PO TABS Oral Take 75 mg by mouth daily.      Marland Kitchen FAMOTIDINE 20 MG PO TABS Oral Take 20 mg by mouth 2 (two) times daily.      Marland Kitchen FERROUS SULFATE 325 (65 FE) MG PO TABS Oral Take 325 mg by mouth daily with breakfast.      . GABAPENTIN 300 MG PO CAPS Oral Take 300 mg by mouth 3 (three) times daily.      Marland Kitchen LOPERAMIDE HCL 2 MG PO CAPS Oral Take 2 mg by mouth 4 (four) times daily as needed.      Bernadette Hoit SODIUM 8.6-50 MG PO TABS Oral Take 1 tablet by mouth daily.      Marland Kitchen SOLIFENACIN SUCCINATE 5 MG PO TABS Oral Take 10 mg by mouth daily.        Pulse 74  Temp(Src) 98 F (36.7 C) (Oral)  Resp 18  SpO2 100%  Physical Exam  Constitutional: She is oriented to person, place, and time. She appears well-developed and well-nourished.  HENT:  Head: Normocephalic.  Eyes: Pupils are equal, round, and reactive  to light.  Neck: Neck supple.  Cardiovascular: Normal rate and regular rhythm.   Pulmonary/Chest: Effort normal. No respiratory distress.  Abdominal: Soft. She exhibits no distension. There is no tenderness.  Musculoskeletal: She exhibits no edema and no tenderness.  Neurological: She is alert and oriented to person, place, and time.  Skin: Skin is warm and dry. No rash noted. No erythema.       Well-healing 4.5cm laceration to right forefoot, between 1st and second toes  Psychiatric: Her behavior is normal.    ED Course  SUTURE REMOVAL Date/Time: 11/12/2010 12:15 PM Performed by: Lorenz Coaster JUSTICE Authorized by: Lyanne Co Consent: Verbal consent obtained. Risks and benefits: risks, benefits and alternatives were discussed Consent given by: patient Patient understanding: patient states understanding of the  procedure being performed Patient consent: the patient's understanding of the procedure matches consent given Patient identity confirmed: arm band and verbally with patient Time out: Immediately prior to procedure a "time out" was called to verify the correct patient, procedure, equipment, support staff and site/side marked as required. Body area: lower extremity Location details: right foot Wound Appearance: clean Sutures Removed: 11 Post-removal: dressing applied Patient tolerance: Patient tolerated the procedure well with no immediate complications.   (including critical care time)    1. Visit for suture removal       MDM  77 y/o F for suture removal. Wound healing well. No other complaints.        Elwyn Reach [place], [place] 11/12/10 1327

## 2010-11-12 NOTE — ED Notes (Signed)
kerlex placed around foot. Pts sock placed back on and shoe that came with pt.

## 2011-08-10 IMAGING — CR DG CHEST 1V PORT
1 series · 1 of 1 positions shown · non-contrast
Comparison: 12/14/2009

CLINICAL DATA: Nonsmoker with shortness of breath and chest pain.

PORTABLE CHEST - 1 VIEW

[view not recorded]
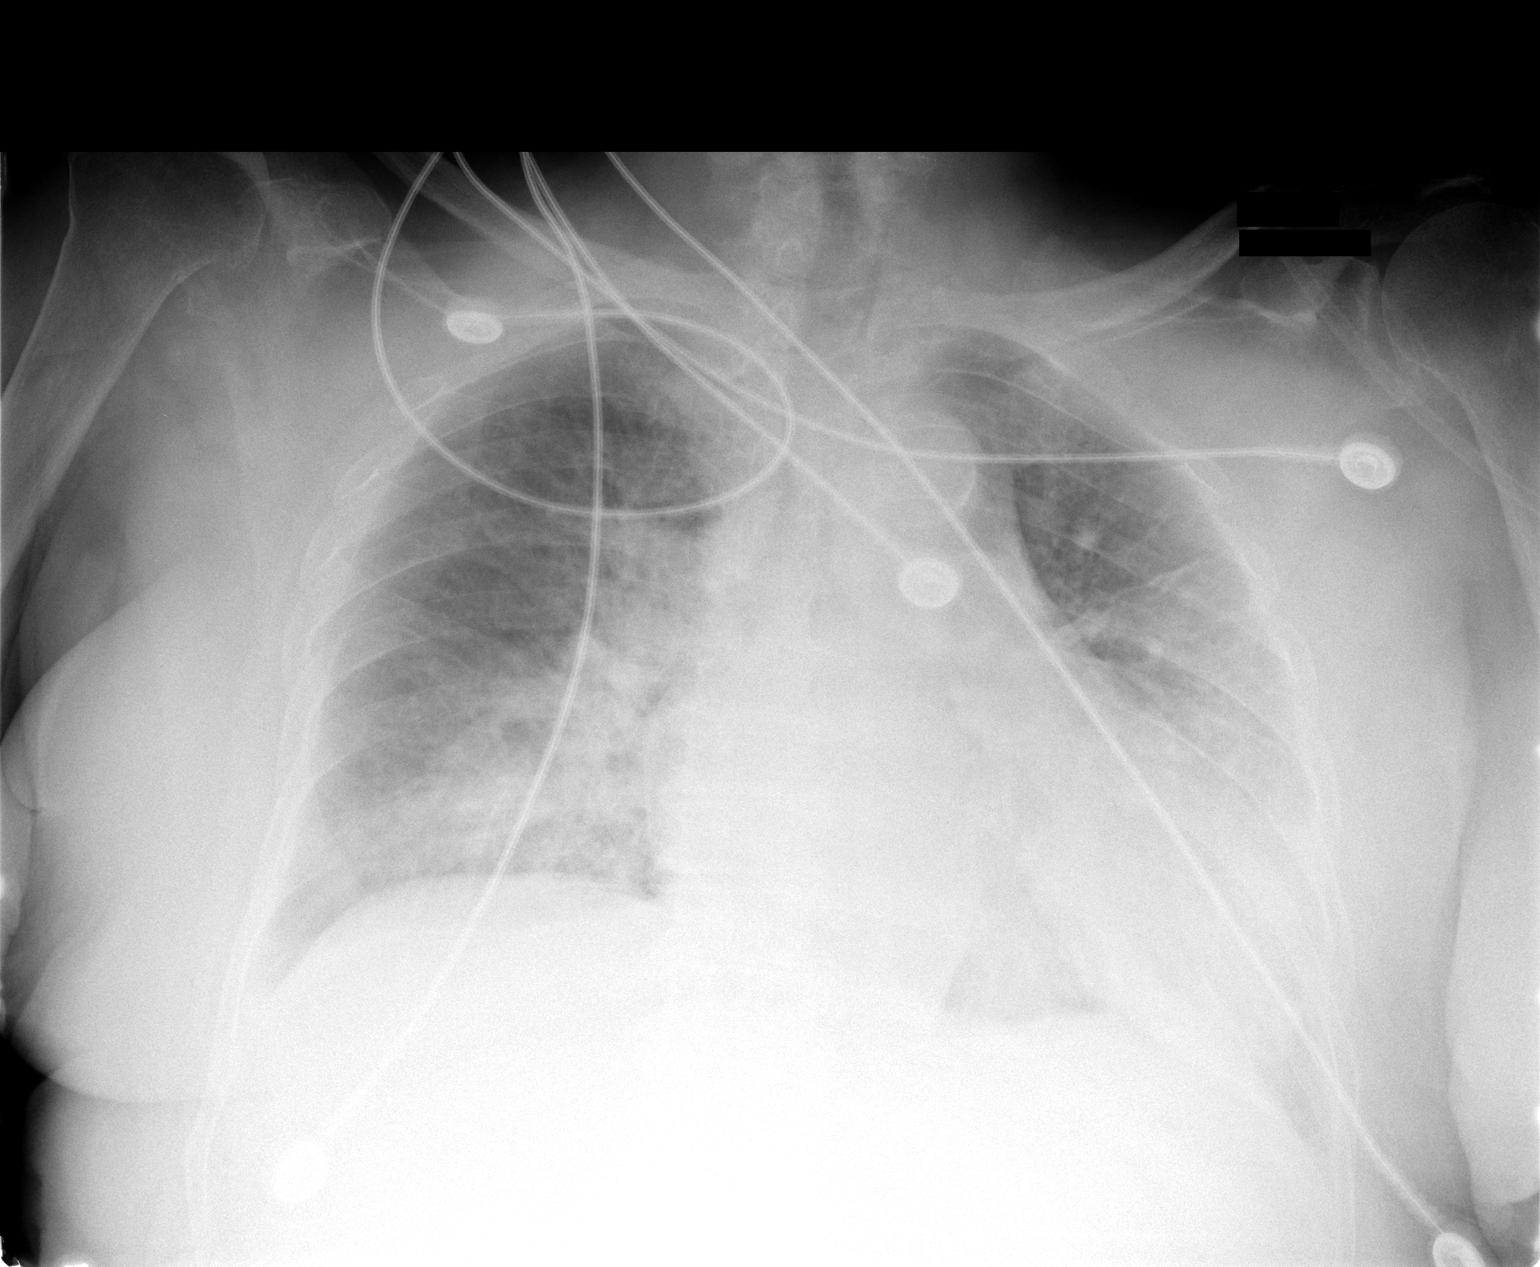

[1 of 1 positions shown; findings below may reference images not displayed]

FINDINGS: Apical lordotic frontal view. Midline trachea.
Cardiomegaly accentuated by AP portable technique.  No right and no
definite left pleural effusion.  Lower lobe predominant
interstitial and airspace disease bilaterally.  Underlying
pulmonary interstitial prominence involves the upper lungs as well.
IMPRESSION: 1.  Bilateral interstitial and airspace disease.  Either pulmonary
edema or multifocal infection.
2.  Cardiomegaly, accentuated by low lung volumes.
3.  Consider further evaluation with PA and lateral radiographs.

## 2011-09-24 ENCOUNTER — Emergency Department (HOSPITAL_COMMUNITY): Payer: PRIVATE HEALTH INSURANCE

## 2011-09-24 ENCOUNTER — Encounter (HOSPITAL_COMMUNITY): Payer: Self-pay | Admitting: *Deleted

## 2011-09-24 ENCOUNTER — Emergency Department (HOSPITAL_COMMUNITY)
Admission: EM | Admit: 2011-09-24 | Discharge: 2011-09-24 | Disposition: A | Discharge status: Skilled Nursing Facility | Payer: PRIVATE HEALTH INSURANCE | Attending: Emergency Medicine | Admitting: Emergency Medicine

## 2011-09-24 DIAGNOSIS — S88119A Complete traumatic amputation at level between knee and ankle, unspecified lower leg, initial encounter: Secondary | ICD-10-CM | POA: Insufficient documentation

## 2011-09-24 DIAGNOSIS — K920 Hematemesis: Secondary | ICD-10-CM | POA: Insufficient documentation

## 2011-09-24 DIAGNOSIS — Z79899 Other long term (current) drug therapy: Secondary | ICD-10-CM | POA: Insufficient documentation

## 2011-09-24 DIAGNOSIS — Z8673 Personal history of transient ischemic attack (TIA), and cerebral infarction without residual deficits: Secondary | ICD-10-CM | POA: Insufficient documentation

## 2011-09-24 DIAGNOSIS — I129 Hypertensive chronic kidney disease with stage 1 through stage 4 chronic kidney disease, or unspecified chronic kidney disease: Secondary | ICD-10-CM | POA: Insufficient documentation

## 2011-09-24 DIAGNOSIS — R110 Nausea: Secondary | ICD-10-CM

## 2011-09-24 DIAGNOSIS — N183 Chronic kidney disease, stage 3 unspecified: Secondary | ICD-10-CM | POA: Insufficient documentation

## 2011-09-24 DIAGNOSIS — D649 Anemia, unspecified: Secondary | ICD-10-CM

## 2011-09-24 DIAGNOSIS — N390 Urinary tract infection, site not specified: Secondary | ICD-10-CM

## 2011-09-24 LAB — URINALYSIS, ROUTINE W REFLEX MICROSCOPIC
Bilirubin Urine: NEGATIVE
Glucose, UA: NEGATIVE mg/dL
Ketones, ur: NEGATIVE mg/dL
Nitrite: NEGATIVE
Protein, ur: 100 mg/dL — AB
Specific Gravity, Urine: 1.013 (ref 1.005–1.030)
Urobilinogen, UA: 0.2 mg/dL (ref 0.0–1.0)
pH: 6 (ref 5.0–8.0)

## 2011-09-24 LAB — COMPREHENSIVE METABOLIC PANEL
ALT: 9 U/L (ref 0–35)
AST: 17 U/L (ref 0–37)
Albumin: 3.6 g/dL (ref 3.5–5.2)
Calcium: 9.6 mg/dL (ref 8.4–10.5)
GFR calc Af Amer: 29 mL/min — ABNORMAL LOW (ref >90)
Potassium: 3.9 mEq/L (ref 3.5–5.1)
Sodium: 140 mEq/L (ref 135–145)
Total Protein: 7.3 g/dL (ref 6.0–8.3)

## 2011-09-24 LAB — CBC WITH DIFFERENTIAL/PLATELET
Basophils Absolute: 0 10*3/uL (ref 0.0–0.1)
Basophils Relative: 0 % (ref 0–1)
Eosinophils Absolute: 0.3 10*3/uL (ref 0.0–0.7)
Eosinophils Relative: 4 % (ref 0–5)
MCH: 25.1 pg — ABNORMAL LOW (ref 26.0–34.0)
MCV: 76.7 fL — ABNORMAL LOW (ref 78.0–100.0)
Neutrophils Relative %: 67 % (ref 43–77)
Platelets: 138 10*3/uL — ABNORMAL LOW (ref 150–400)
RDW: 15.4 % (ref 11.5–15.5)

## 2011-09-24 LAB — URINE MICROSCOPIC-ADD ON

## 2011-09-24 LAB — OCCULT BLOOD, POC DEVICE: Fecal Occult Bld: NEGATIVE

## 2011-09-24 LAB — LIPASE, BLOOD: Lipase: 33 U/L (ref 11–59)

## 2011-09-24 MED ORDER — ONDANSETRON HCL 4 MG/2ML IJ SOLN
4.0000 mg | Freq: Once | INTRAMUSCULAR | Status: AC
  Administered 2011-09-24: 4 mg via INTRAVENOUS
  Filled 2011-09-24: qty 2

## 2011-09-24 MED ORDER — CIPROFLOXACIN HCL 500 MG PO TABS
500.0000 mg | ORAL_TABLET | Freq: Two times a day (BID) | ORAL | Status: DC
Start: 2011-09-24 — End: 2013-09-10

## 2011-09-24 MED ORDER — ONDANSETRON HCL 4 MG PO TABS: 4.0000 mg | ORAL_TABLET | Freq: Four times a day (QID) | ORAL | Status: DC | PRN

## 2011-09-24 MED ORDER — PANTOPRAZOLE SODIUM 40 MG IV SOLR
40.0000 mg | Freq: Once | INTRAVENOUS | Status: AC
  Administered 2011-09-24: 40 mg via INTRAVENOUS
  Filled 2011-09-24: qty 40

## 2011-09-24 MED ORDER — SODIUM CHLORIDE 0.9 % IV BOLUS (SEPSIS)
500.0000 mL | Freq: Once | INTRAVENOUS | Status: AC
  Administered 2011-09-24: 09:00:00 via INTRAVENOUS

## 2011-09-24 NOTE — ED Notes (Signed)
[facility] care staff stated that she was Heme positive when they checked. She denies any pain at this time.  V/S WNL.  She is not showing any signs of distress  At this time

## 2011-09-24 NOTE — ED Notes (Signed)
Dr Norlene Campbell notified that patient has reported hematemesis.

## 2011-09-24 NOTE — ED Notes (Signed)
EMS called to [facility].  Found patient in bed.  Staff states that she was tested for hematemesis And it was positive.  She was sent in for evaluation.  Denies pain or discomfort only nausea

## 2011-09-24 NOTE — ED Notes (Signed)
Gave patient pillow to put under leg

## 2011-09-24 NOTE — ED Notes (Signed)
[facility] tech in room.

## 2011-09-24 NOTE — ED Notes (Signed)
Pt requesting drink, informed would speak to MD, informed Dr. Oletta Lamas [place] has been completed, is to look @ results, pt informed & verbalized understanding.

## 2011-09-24 NOTE — ED Notes (Signed)
Awaiting P-Tar

## 2011-09-24 NOTE — ED Provider Notes (Signed)
 History     CSN: [id number]  Arrival date & time 09/24/11  0055   First MD Initiated Contact with Patient 09/24/11 [phone]      Chief Complaint  Patient presents with  . Hematemesis    (Consider location/radiation/quality/duration/timing/severity/associated sxs/prior treatment) HPI Comments: Per pt and RN report, pt is at [facility], she has had nausea for 1-2 days. Had 2 episodes of vomiting and it was tested for blood, found to be positive.  Pt denies having "internal bleeding" in the past, but prior records shows pt has been profoundly anemic in the past. Prior records also indicate h/o chronic anemia, prior stroke and some deficits in mental capacity likely due to prior strokes.  Pt denies CP.  She has pain in right side which she attributes to staff having to pick her up to help her transition and holds her at the sides.  She denies blood in stool, black stools.  Level 5 caveat, dementia.    The history is provided by the patient.    Past Medical History  Diagnosis Date  . CVA (cerebral infarction)   . Diastolic heart failure   . Anemia   . Atrial fibrillation   . Acute on chronic kidney disease, stage 3   . Hypertension   . Hx of BKA   . Kidney stones   . Pancreatitis   . Urinary tract infection     Past Surgical History  Procedure Date  . Below knee leg amputation     History reviewed. No pertinent family history.  History  Substance Use Topics  . Smoking status: Never Smoker   . Smokeless tobacco: Never Used  . Alcohol Use: No    OB History    Grav Para Term Preterm Abortions TAB SAB Ect Mult Living                  Review of Systems  Unable to perform ROS: Dementia    Allergies  Iodinated diagnostic agents and Penicillins  Home Medications   Current Outpatient Rx  Name Route Sig Dispense Refill  . AMLODIPINE BESYLATE 10 MG PO TABS Oral Take 10 mg by mouth daily.      . ATORVASTATIN CALCIUM 20 MG PO TABS Oral Take 20 mg by mouth at bedtime.     Marland Kitchen BISACODYL 10 MG RE SUPP Rectal Place 10 mg rectally daily as needed. constipation     . CAMPHOR-MENTHOL 0.5-0.5 % EX LOTN Topical Apply 1 application topically 4 (four) times daily as needed.    Marland Kitchen CARVEDILOL 3.125 MG PO TABS Oral Take 3.125 mg by mouth 2 (two) times daily.     Marland Kitchen CITALOPRAM HYDROBROMIDE 10 MG PO TABS Oral Take 5 mg by mouth at bedtime.     . CLOPIDOGREL BISULFATE 75 MG PO TABS Oral Take 75 mg by mouth daily.      Marland Kitchen CRANBERRY 425 MG PO CAPS Oral Take 1 capsule by mouth daily.    Marland Kitchen DIPHENHYDRAMINE HCL 12.5 MG/5ML PO LIQD Oral Take 12.5 mg by mouth 2 (two) times daily as needed. allergies     . FAMOTIDINE 20 MG PO TABS Oral Take 20 mg by mouth daily.     Marland Kitchen FERROUS SULFATE 325 (65 FE) MG PO TABS Oral Take 325 mg by mouth daily with breakfast.      . GABAPENTIN 300 MG PO CAPS Oral Take 300 mg by mouth 2 (two) times daily.     Marland Kitchen HYDROCODONE-ACETAMINOPHEN 5-325 MG PO TABS Oral  Take 1-2 tablets by mouth every 4 (four) hours as needed. Mild to severe pain     . HYDROXYZINE HCL 25 MG PO TABS Oral Take 25 mg by mouth 3 (three) times daily as needed. For itching    . LEVALBUTEROL TARTRATE 45 MCG/ACT IN AERO Inhalation Inhale 2 puffs into the lungs every 6 (six) hours as needed. dyspnea     . LORAZEPAM 0.5 MG PO TABS Oral Take 0.5 mg by mouth at bedtime.    Marland Kitchen LORAZEPAM 0.5 MG PO TABS Oral Take 0.5 mg by mouth 2 (two) times daily as needed. For agitation    . THERA M PLUS PO TABS Oral Take 1 tablet by mouth daily.      . OLOPATADINE HCL 0.2 % OP SOLN Both Eyes Place 1 drop into both eyes daily.    Marland Kitchen POLYVINYL ALCOHOL 1.4 % OP SOLN Both Eyes Place 1 drop into both eyes 4 (four) times daily as needed.    Marland Kitchen POTASSIUM CHLORIDE 10 MEQ PO TBCR Oral Take 10 mEq by mouth every other day.      Marland Kitchen RISPERIDONE 0.25 MG PO TABS Oral Take 0.25 mg by mouth at bedtime.     Bernadette Hoit SODIUM 8.6-50 MG PO TABS Oral Take 1 tablet by mouth 2 (two) times daily.     Marland Kitchen SOLIFENACIN SUCCINATE 5 MG PO  TABS Oral Take 5 mg by mouth daily.     . TRAMADOL HCL 50 MG PO TABS Oral Take 50 mg by mouth every 8 (eight) hours as needed. Pain. Maximum dose= 8 tablets per day     . VITAMIN D (ERGOCALCIFEROL) 50000 UNITS PO CAPS Oral Take 50,000 Units by mouth every 30 (thirty) days.    Marland Kitchen ONDANSETRON HCL 4 MG PO TABS Oral Take 1 tablet (4 mg total) by mouth every 6 (six) hours as needed for nausea. 12 tablet 0    BP 112/93  Pulse 84  Temp 98.2 F (36.8 C) (Oral)  Resp 20  SpO2 99%  Physical Exam  Nursing note and vitals reviewed. Constitutional: She appears well-developed and well-nourished. No distress.  HENT:  Head: Normocephalic and atraumatic.  Eyes: Pupils are equal, round, and reactive to light. No scleral icterus.  Neck: Normal range of motion.  Cardiovascular: Normal rate and regular rhythm.   No murmur heard. Pulmonary/Chest: Effort normal and breath sounds normal.  Abdominal: Soft. She exhibits no distension. There is no tenderness. There is no rebound.  Genitourinary: Rectal exam shows no tenderness.       Chaperone present. Stool is black  Musculoskeletal: She exhibits no edema.  Neurological: She is alert. Coordination normal.  Skin: Skin is warm and dry. No rash noted. She is not diaphoretic.  Psychiatric: She has a normal mood and affect.    ED Course  Procedures (including critical care time)  Labs Reviewed  CBC WITH DIFFERENTIAL - Abnormal; Notable for the following:    Hemoglobin 11.3 (*)     HCT 34.5 (*)     MCV 76.7 (*)     MCH 25.1 (*)     Platelets 138 (*)     All other components within normal limits  COMPREHENSIVE METABOLIC PANEL - Abnormal; Notable for the following:    Glucose, Bld 127 (*)     BUN 26 (*)     Creatinine, Ser 1.79 (*)     Total Bilirubin 0.2 (*)     GFR calc non Af Amer 25 (*)  GFR calc Af Amer 29 (*)     All other components within normal limits  URINALYSIS, ROUTINE W REFLEX MICROSCOPIC - Abnormal; Notable for the following:     APPearance CLOUDY (*)     Hgb urine dipstick SMALL (*)     Protein, ur 100 (*)     Leukocytes, UA LARGE (*)     All other components within normal limits  URINE MICROSCOPIC-ADD ON - Abnormal; Notable for the following:    Bacteria, UA MANY (*)     All other components within normal limits  LIPASE, BLOOD  OCCULT BLOOD, POC DEVICE   US Abdomen Complete  09/24/2011  *RADIOLOGY REPORT*  Clinical Data:  Abdominal pain.  The patient did not fast prior to the study.  COMPLETE ABDOMINAL ULTRASOUND  Comparison:  CT of the abdomen and pelvis without contrast 12/16/2009  Findings:  Gallbladder:  The gallbladder is surgically absent.  Common bile duct:  Normal in caliber. No biliary ductal dilation.The maximal diameter is 5 mm, within normal limits.  Liver:  There is loss of normal internal echotexture, suggesting diffuse fatty infiltration.  No focal lesions are present.  IVC:  Appears normal.  Pancreas:  The pancreas is not visualized secondary patient body habitus and overlying bowel gas.  The  Spleen:  Normal size and echotexture without focal parenchymal abnormality.  The maximal length is 7.2 cm, within normal limits.  Right Kidney:  No hydronephrosis.  Well-preserved cortex.  Normal size and parenchymal echotexture without focal abnormalities. The maximal length is 10.7, within normal limits.  Left Kidney:  The left kidney is poorly visualized due to patient body habitus and overlying bowel gas.  This limits detail.  There is no hydronephrosis or mass lesion.  The maximal length is 10.8 cm, within normal limits.  Abdominal aorta:  The visualized portions of the aorta are normal.  IMPRESSION:  1.  Status post cholecystectomy. 2.  Mild fatty infiltration of the liver.  No focal lesions are evident. 3.  Poor visualization of the pancreas and left kidney secondary to patient body habitus and overlying bowel gas.   Original Report Authenticated By: Jamesetta Orleans. MATTERN, M.D.      1. Nausea   2. Anemia     11:23 AM U/S is ok.  Hemoccult shows no blood.  VS remain stable, not orthostatic.  vomiting seems to have resolved, she is hungry, tolerating PO's.  Will give Rx for nausea and d/c back to facility.     MDM  Hgb is low, but higher than prior values.  However BUN and Cr are elevated suggesting dehydration.  Will give IVF's.  This also suggests true Hgb is likely lower.  Will perform hemoccult and disposition based on this.          Gavin Pound. Darlene Bartelt, MD 09/24/11 1124

## 2011-12-05 IMAGING — CR DG CHEST 1V PORT
1 series · 1 of 1 positions shown · non-contrast
Comparison: Chest radiograph performed 01/02/2010

CLINICAL DATA: Weakness; altered level of consciousness.

PORTABLE CHEST - 1 VIEW

[AP]
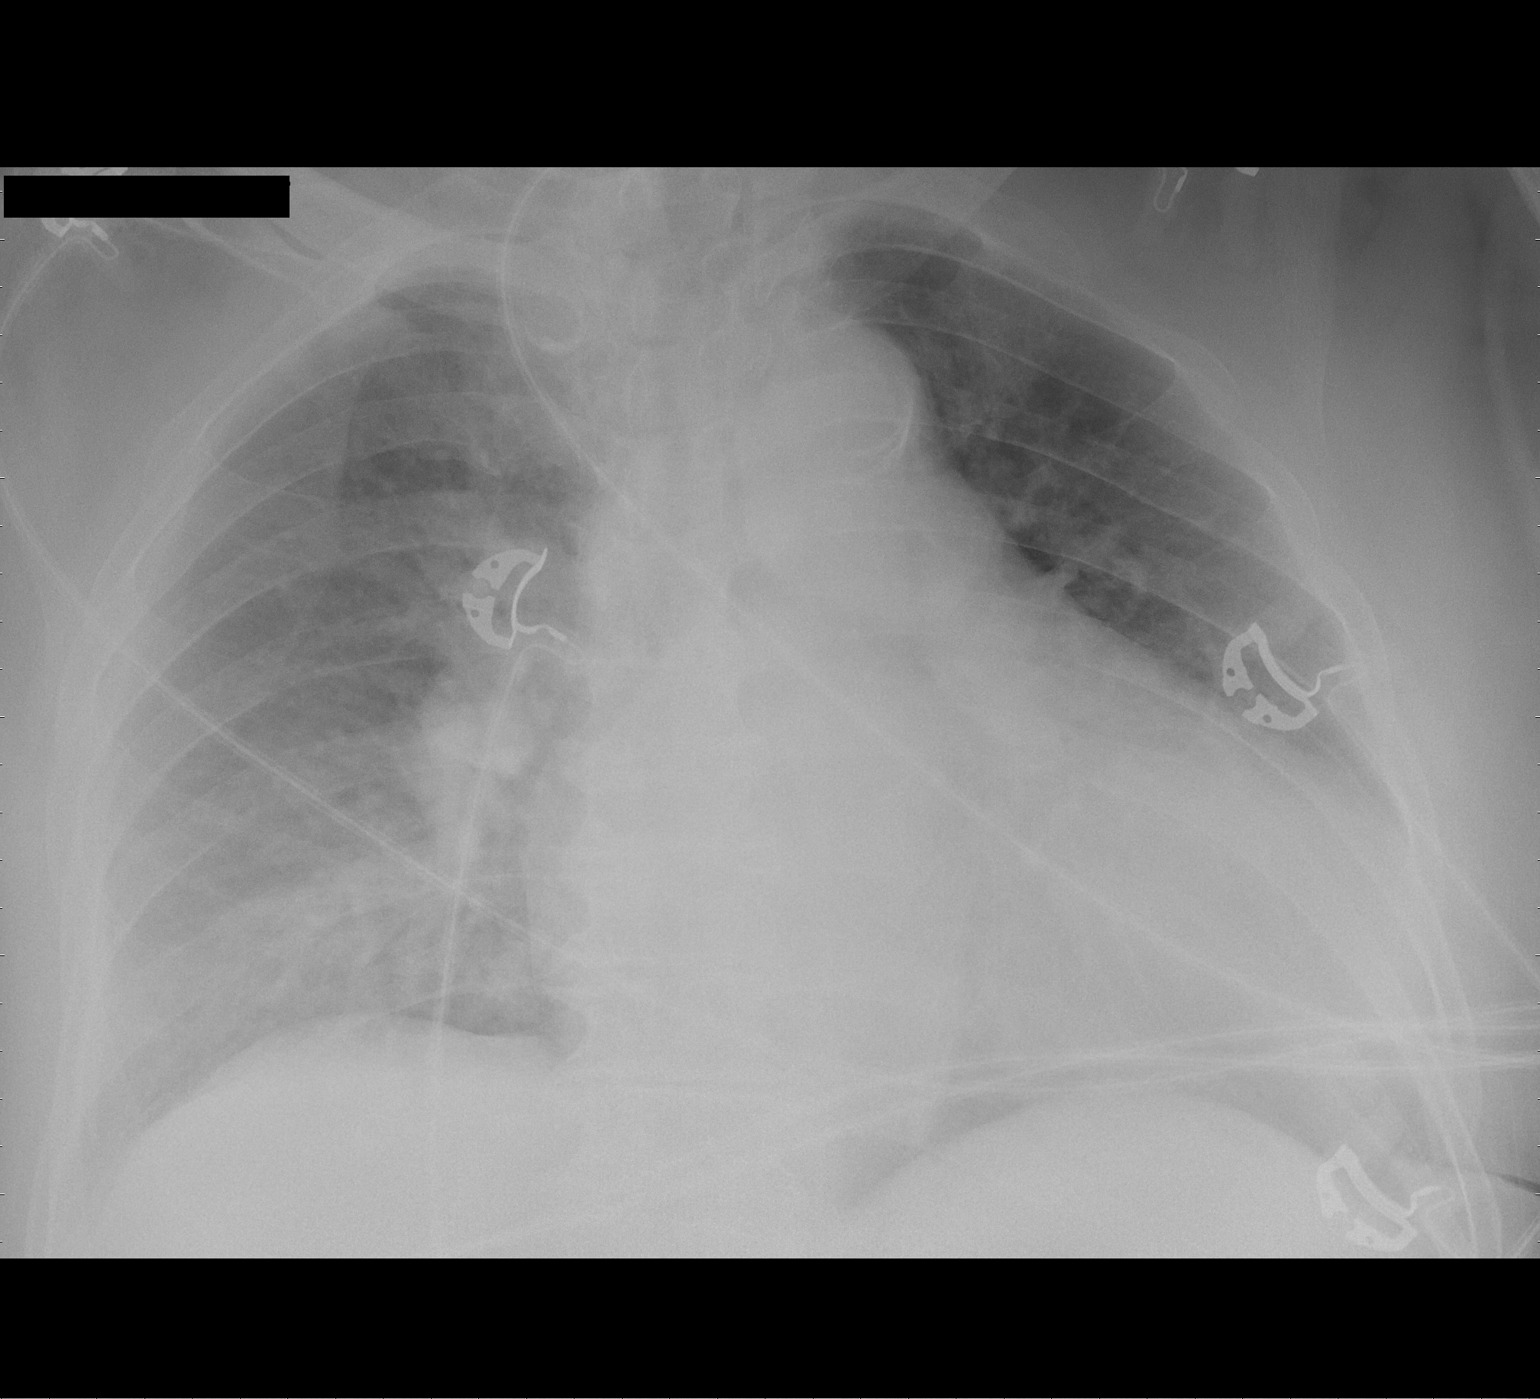

[1 of 1 positions shown; findings below may reference images not displayed]

FINDINGS: Lung expansion is mildly improved.  Mild bibasilar
airspace opacities are noted, with increased interstitial markings
and vascular congestion, likely reflecting mild interstitial edema.
No pleural effusion or pneumothorax is identified.

The cardiomediastinal silhouette is enlarged; calcification is
noted within the aortic arch.  No acute osseous abnormalities are
identified.
IMPRESSION: Mild bibasilar airspace opacities, with vascular congestion and
cardiomegaly, likely reflecting mild interstitial edema.

## 2011-12-05 IMAGING — CT CT HEAD W/O CM
1 of 2 series · 13 of 30 positions shown, 17 images · non-contrast
Comparison: CT of the head performed 12/15/2009

CLINICAL DATA: Aphasia; unable to move left side.

CT HEAD WITHOUT CONTRAST
TECHNIQUE: Contiguous axial images were obtained from the base of
the skull through the vertex without contrast.

[Series 2: brain · axial · 0.47mm/px · z∈[-139,+0]mm · 13 of 32 slices shown, 17 images]
[im 3/32  brain]
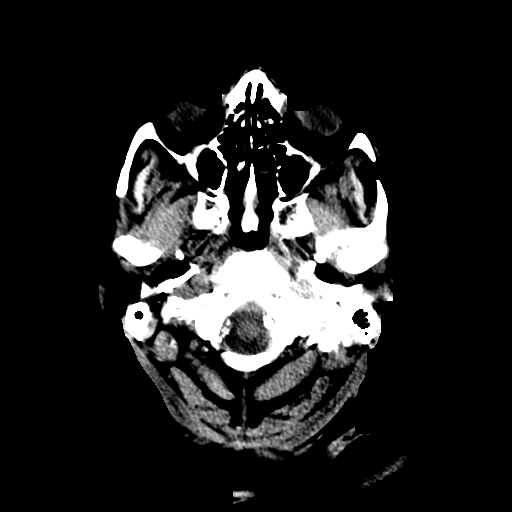
[im 3/32  bone]
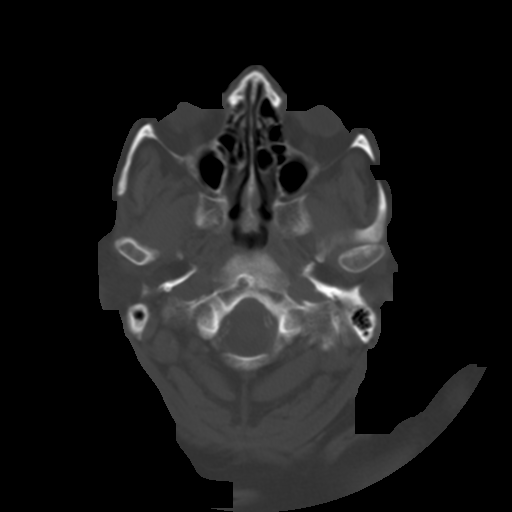
[im 5/32  brain]
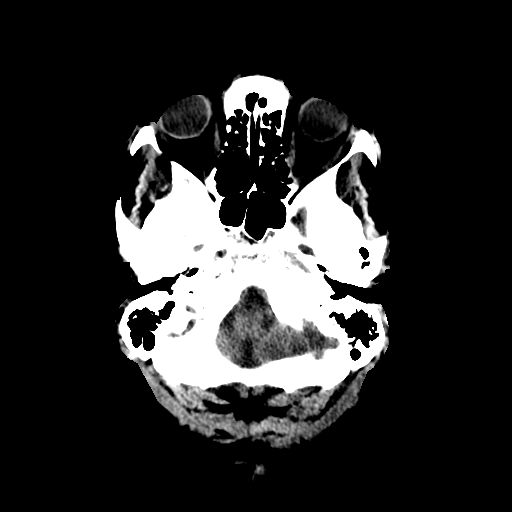
[im 7/32  brain]
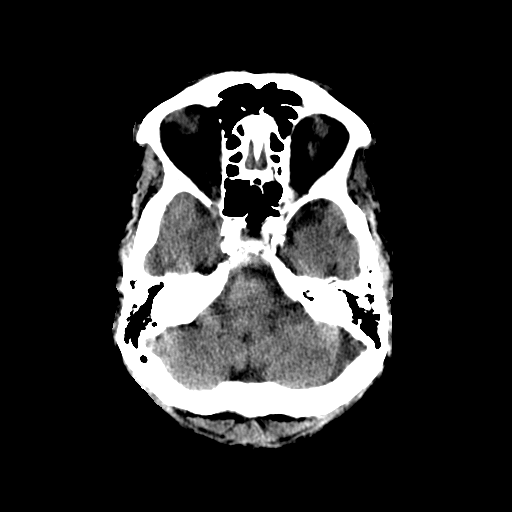
[im 9/32  brain]
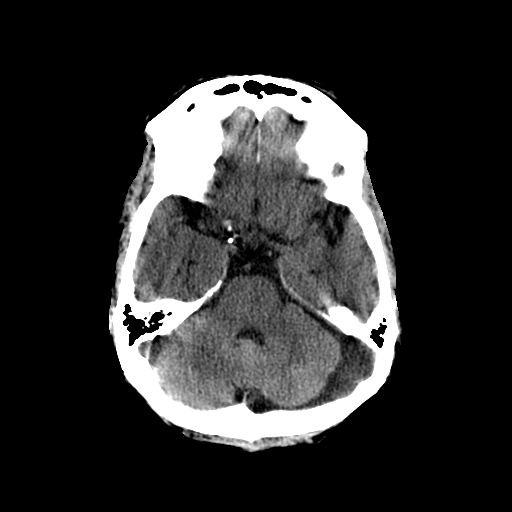
[im 12/32  brain]
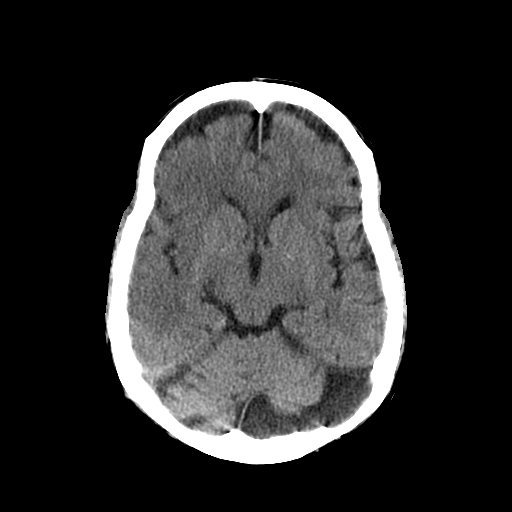
[im 12/32  bone]
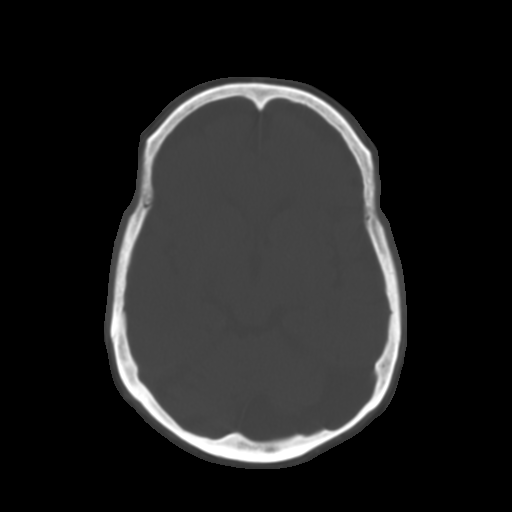
[im 14/32  brain]
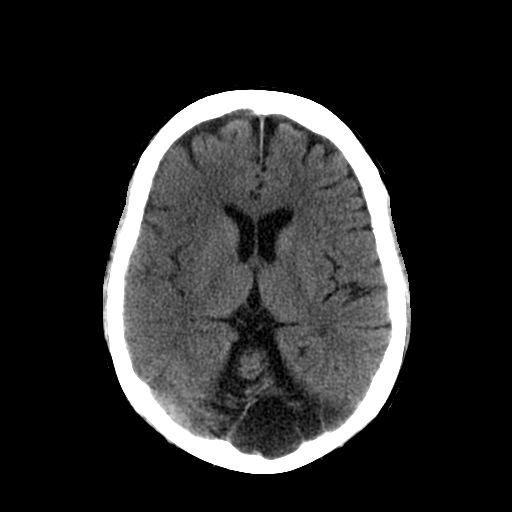
[im 16/32  brain]
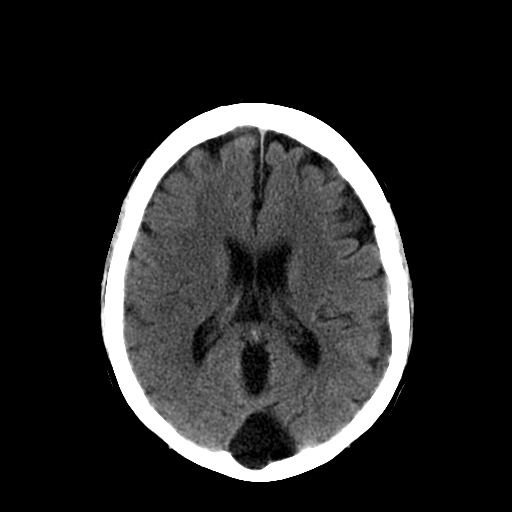
[im 18/32  brain]
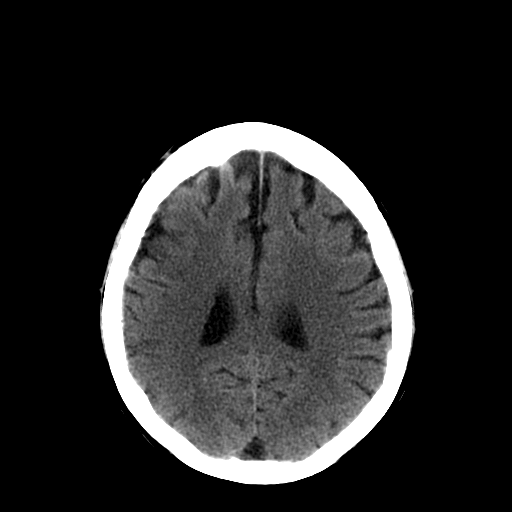
[im 20/32  brain]
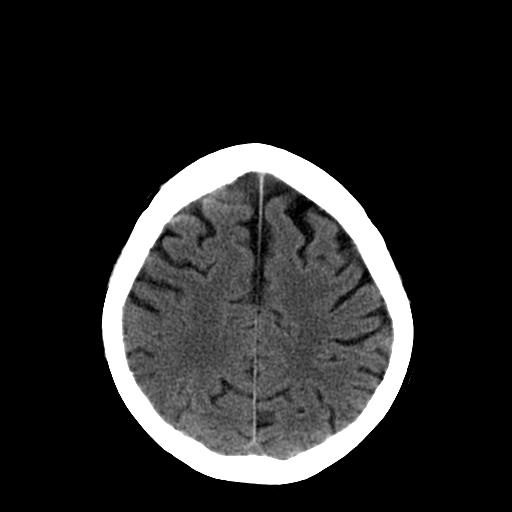
[im 20/32  bone]
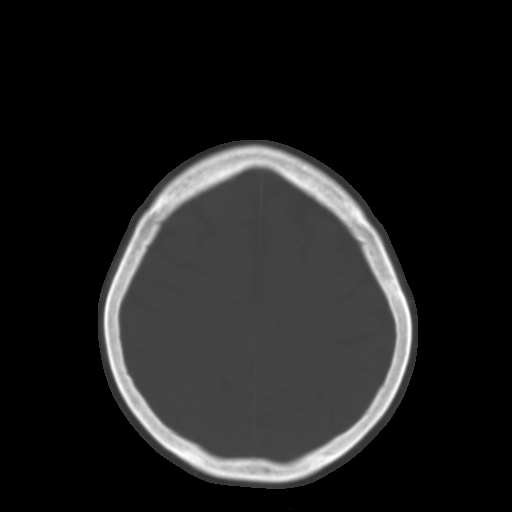
[im 23/32  brain]
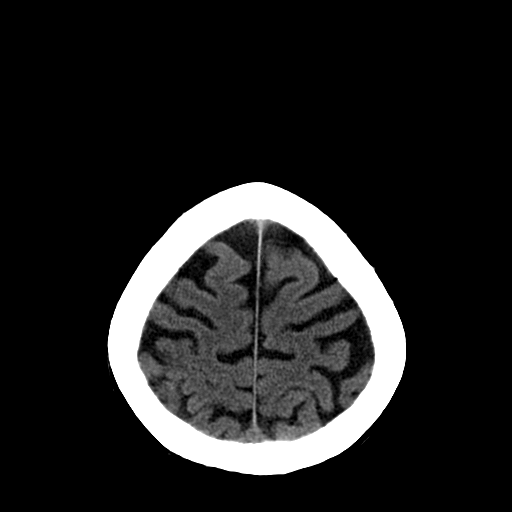
[im 25/32  brain]
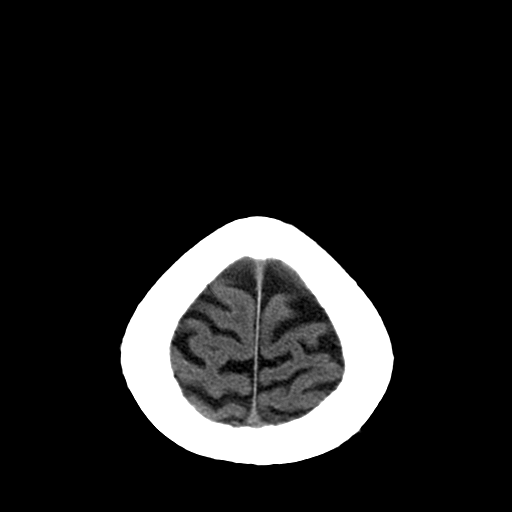
[im 27/32  brain]
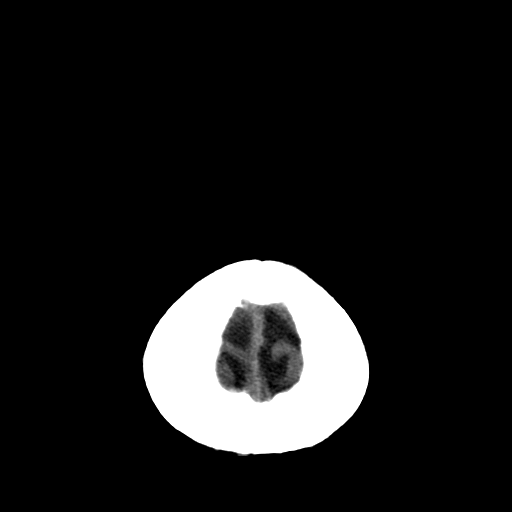
[im 29/32  brain]
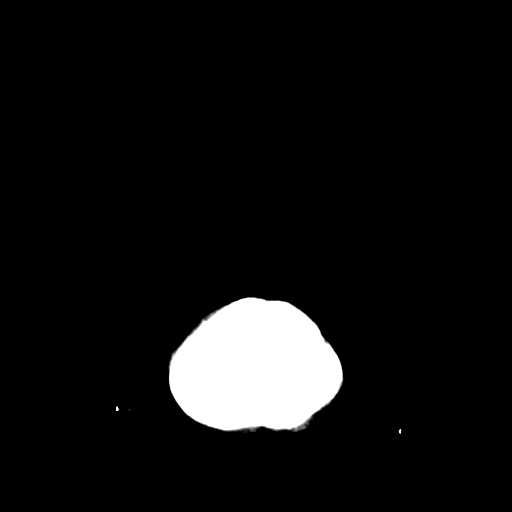
[im 29/32  bone]
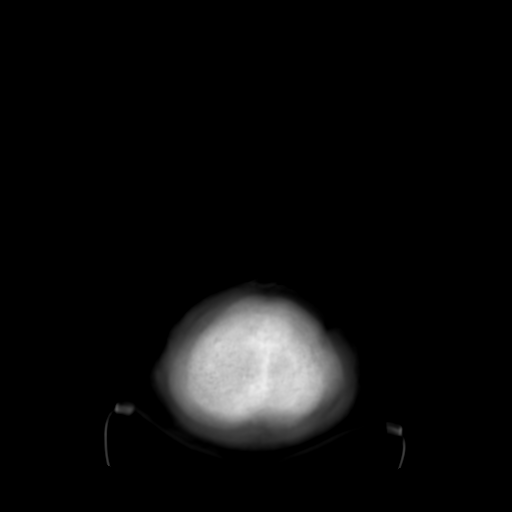

[13 of 30 positions shown; findings below may reference images not displayed]

FINDINGS: There is no evidence of acute infarction, mass lesion, or
intra- or extra-axial hemorrhage on CT.

The patient's large left posterior fossa arachnoid cyst is
unchanged in appearance. Prominence of the ventricles and sulci
reflects mild cortical volume loss.  Mild cerebellar atrophy is
noted.

The posterior fossa, including the cerebellum, brainstem and fourth
ventricle, is otherwise unremarkable in appearance.  The basal
ganglia are within normal limits.  The cerebral hemispheres are
symmetric in appearance, with normal gray-white differentiation.
No mass effect or midline shift is seen.

There is no evidence of fracture; visualized osseous structures are
unremarkable in appearance.  The orbits are within normal limits.
The paranasal sinuses and mastoid air cells are well-aerated.  No
significant soft tissue abnormalities are seen.
IMPRESSION: 1.  No acute intracranial pathology seen on CT.
2.  Stable appearance of large left posterior fossa arachnoid cyst.
3.  Mild cortical volume loss again noted.

## 2012-07-06 ENCOUNTER — Other Ambulatory Visit: Payer: Self-pay | Admitting: *Deleted

## 2012-07-06 DIAGNOSIS — I739 Peripheral vascular disease, unspecified: Secondary | ICD-10-CM

## 2012-07-06 DIAGNOSIS — L97909 Non-pressure chronic ulcer of unspecified part of unspecified lower leg with unspecified severity: Secondary | ICD-10-CM

## 2012-07-09 ENCOUNTER — Other Ambulatory Visit: Payer: Self-pay | Admitting: Nephrology

## 2012-07-15 ENCOUNTER — Ambulatory Visit
Admission: RE | Admit: 2012-07-15 | Discharge: 2012-07-15 | Disposition: A | Discharge status: Home / Self Care | Payer: PRIVATE HEALTH INSURANCE | Source: Ambulatory Visit | Attending: Nephrology | Admitting: Nephrology

## 2012-07-15 ENCOUNTER — Other Ambulatory Visit: Payer: PRIVATE HEALTH INSURANCE

## 2012-07-15 ENCOUNTER — Encounter: Payer: Self-pay | Admitting: Vascular Surgery

## 2012-07-16 ENCOUNTER — Encounter: Payer: Self-pay | Admitting: Vascular Surgery

## 2012-07-16 ENCOUNTER — Other Ambulatory Visit: Payer: Self-pay | Admitting: *Deleted

## 2012-07-16 ENCOUNTER — Ambulatory Visit (INDEPENDENT_AMBULATORY_CARE_PROVIDER_SITE_OTHER): Payer: PRIVATE HEALTH INSURANCE | Admitting: Vascular Surgery

## 2012-07-16 ENCOUNTER — Encounter (INDEPENDENT_AMBULATORY_CARE_PROVIDER_SITE_OTHER): Payer: PRIVATE HEALTH INSURANCE | Admitting: *Deleted

## 2012-07-16 VITALS — BP 97/61 | HR 80 | Ht 67.0 in | Wt 180.0 lb

## 2012-07-16 DIAGNOSIS — I739 Peripheral vascular disease, unspecified: Secondary | ICD-10-CM

## 2012-07-16 DIAGNOSIS — L97909 Non-pressure chronic ulcer of unspecified part of unspecified lower leg with unspecified severity: Secondary | ICD-10-CM

## 2012-07-16 DIAGNOSIS — L97409 Non-pressure chronic ulcer of unspecified heel and midfoot with unspecified severity: Secondary | ICD-10-CM

## 2012-07-16 DIAGNOSIS — I7025 Atherosclerosis of native arteries of other extremities with ulceration: Secondary | ICD-10-CM | POA: Insufficient documentation

## 2012-07-16 DIAGNOSIS — L98499 Non-pressure chronic ulcer of skin of other sites with unspecified severity: Secondary | ICD-10-CM

## 2012-07-16 NOTE — Progress Notes (Signed)
[facility] [place] HISTORY AND PHYSICAL   History of Present Illness:  Patient is a 79 y.o. year old female who presents for evaluation of right heel decubitus ulcer.  She has had a prior left AKA.  She is non ambulatory and has a right knee contracture.  She has some dementia.  Other medical problems include congestive heart failure, a fib, hypertension, prior stroke all of which are stable.  The patient's daughter states there was a large dry eschar on the heel which has recently been debrided.    Past Medical History  Diagnosis Date  . CVA (cerebral infarction)   . Diastolic heart failure   . Anemia   . Atrial fibrillation   . Acute on chronic kidney disease, stage 3   . Hypertension   . Hx of BKA   . Kidney stones   . Pancreatitis   . Urinary tract infection     Past Surgical History  Procedure Laterality Date  . Below knee leg amputation    Above knee amputation left err above   Social History History  Substance Use Topics  . Smoking status: Never Smoker   . Smokeless tobacco: Never Used  . Alcohol Use: No    Family History History reviewed. No pertinent family history.  Allergies  Allergies  Allergen Reactions  . Iodinated Diagnostic Agents     Per MAR  . Penicillins     Per  [facility]     Current Outpatient Prescriptions  Medication Sig Dispense Refill  . amLODipine (NORVASC) 10 MG tablet Take 10 mg by mouth daily.        Marland Kitchen atorvastatin (LIPITOR) 20 MG tablet Take 20 mg by mouth at bedtime.      . bisacodyl (DULCOLAX) 10 MG suppository Place 10 mg rectally daily as needed. constipation       . camphor-menthol (SARNA) lotion Apply 1 application topically 4 (four) times daily as needed.      . carvedilol (COREG) 3.125 MG tablet Take 3.125 mg by mouth 2 (two) times daily.       . ciprofloxacin (CIPRO) 500 MG tablet Take 1 tablet (500 mg total) by mouth 2 (two) times daily.  10 tablet  0  . citalopram (CELEXA) 10 MG tablet Take 5 mg by mouth  at bedtime.       . clopidogrel (PLAVIX) 75 MG tablet Take 75 mg by mouth daily.        . Cranberry 425 MG CAPS Take 1 capsule by mouth daily.      . diphenhydrAMINE (BENADRYL) 12.5 MG/5ML liquid Take 12.5 mg by mouth 2 (two) times daily as needed. allergies       . famotidine (PEPCID) 20 MG tablet Take 20 mg by mouth daily.       . ferrous sulfate 325 (65 FE) MG tablet Take 325 mg by mouth daily with breakfast.        . gabapentin (NEURONTIN) 300 MG capsule Take 300 mg by mouth 2 (two) times daily.       Marland Kitchen HYDROcodone-acetaminophen (NORCO) 5-325 MG per tablet Take 1-2 tablets by mouth every 4 (four) hours as needed. Mild to severe pain       . hydrOXYzine (ATARAX/VISTARIL) 25 MG tablet Take 25 mg by mouth 3 (three) times daily as needed. For itching      . levalbuterol (XOPENEX HFA) 45 MCG/ACT inhaler Inhale 2 puffs into the lungs every 6 (six) hours as needed. dyspnea       .  LORazepam (ATIVAN) 0.5 MG tablet Take 0.5 mg by mouth at bedtime.      Marland Kitchen LORazepam (ATIVAN) 0.5 MG tablet Take 0.5 mg by mouth 2 (two) times daily as needed. For agitation      . Multiple Vitamins-Minerals (MULTIVITAMINS THER. W/MINERALS) TABS Take 1 tablet by mouth daily.        . Olopatadine HCl (PATADAY) 0.2 % SOLN Place 1 drop into both eyes daily.      . ondansetron (ZOFRAN) 4 MG tablet Take 1 tablet (4 mg total) by mouth every 6 (six) hours as needed for nausea.  12 tablet  0  . polyvinyl alcohol (LIQUIFILM TEARS) 1.4 % ophthalmic solution Place 1 drop into both eyes 4 (four) times daily as needed.      . potassium chloride (KLOR-CON) 10 MEQ CR tablet Take 10 mEq by mouth every other day.        . risperiDONE (RISPERDAL) 0.25 MG tablet Take 0.25 mg by mouth at bedtime.       . senna-docusate (SENOKOT-S) 8.6-50 MG per tablet Take 1 tablet by mouth 2 (two) times daily.       . solifenacin (VESICARE) 5 MG tablet Take 5 mg by mouth daily.       . traMADol (ULTRAM) 50 MG tablet Take 50 mg by mouth every 8 (eight) hours as  needed. Pain. Maximum dose= 8 tablets per day       . Vitamin D, Ergocalciferol, (DRISDOL) 50000 UNITS CAPS Take 50,000 Units by mouth every 30 (thirty) days.       No current facility-administered medications for this visit.    ROS:   General:  No weight loss, Fever, chills  HEENT: No recent headaches, no nasal bleeding, no visual changes, no sore throat  Neurologic: No dizziness, blackouts, seizures. No recent symptoms of stroke or mini- stroke. No recent episodes of slurred speech, or temporary blindness.  Cardiac: No recent episodes of chest pain/pressure, no shortness of breath at rest.  + shortness of breath with exertion. + history of atrial fibrillation or irregular heartbeat  Vascular: No history of rest pain in feet.  No history of claudication.  + history of non-healing ulcer, No history of DVT   Pulmonary: No home oxygen, no productive cough, no hemoptysis,  No asthma or wheezing  Musculoskeletal:  [ ]  Arthritis, [ ]  Low back pain,  [ ]  Joint pain  Hematologic:No history of hypercoagulable state.  No history of easy bleeding.  + history of anemia  Gastrointestinal: No hematochezia or melena,  No gastroesophageal reflux, no trouble swallowing  Urinary: [ ]  chronic Kidney disease, [ ]  on HD - [ ]  MWF or [ ]  TTHS, [ ]  Burning with urination, [ ]  Frequent urination, [ ]  Difficulty urinating;   Skin: No rashes  Psychological: No history of anxiety,  No history of depression   Physical Examination  Filed Vitals:   07/16/12 1356  BP: 97/61  Pulse: 80  Height: 5\' 7"  (1.702 m)  Weight: 180 lb (81.647 kg)  SpO2: 100%    Body mass index is 28.19 kg/(m^2).  General:  Alert and oriented, no acute distress HEENT: Normal Neck: No bruit or JVD Pulmonary: Clear to auscultation bilaterally Cardiac: Regular Rate and Rhythm without murmur Abdomen: Soft, non-tender, non-distended, no mass, obese Skin: No rash Extremity Pulses:  2+ radial, brachial, femoral bilaterally  absent dorsalis pedis, posterior tibial pulses right leg Musculoskeletal: 30 degree knee contracture, left AKA  Neurologic: Upper extremity motor 5/5 and  symmetric  DATA: ABI today 1.19 right with toe pressure 152 monophasic waveforms.  I reviewed and interpreted the study   ASSESSMENT: Most likely elevated ABI secondary to calcification although toe pressure is reasonable.  Minimal healing potential due to location of ulcer and decubitus position with contracture   PLAN:  Will refer to [facility] wound center.  Pt is not a candidate for vascular intervention.  Only option would be right AKA if local wound care fails.  She will follow up if AKA required.    Fabienne Bruns, MD [facility] [place] Office: [phone] Pager: [phone]

## 2012-08-04 ENCOUNTER — Encounter (HOSPITAL_BASED_OUTPATIENT_CLINIC_OR_DEPARTMENT_OTHER): Payer: PRIVATE HEALTH INSURANCE | Attending: General Surgery

## 2012-08-04 DIAGNOSIS — L89899 Pressure ulcer of other site, unspecified stage: Secondary | ICD-10-CM | POA: Insufficient documentation

## 2012-08-04 DIAGNOSIS — L89609 Pressure ulcer of unspecified heel, unspecified stage: Secondary | ICD-10-CM | POA: Insufficient documentation

## 2012-08-04 DIAGNOSIS — Z8673 Personal history of transient ischemic attack (TIA), and cerebral infarction without residual deficits: Secondary | ICD-10-CM | POA: Insufficient documentation

## 2012-08-04 DIAGNOSIS — I4891 Unspecified atrial fibrillation: Secondary | ICD-10-CM | POA: Insufficient documentation

## 2012-08-04 DIAGNOSIS — I509 Heart failure, unspecified: Secondary | ICD-10-CM | POA: Insufficient documentation

## 2012-08-04 DIAGNOSIS — L8990 Pressure ulcer of unspecified site, unspecified stage: Secondary | ICD-10-CM | POA: Insufficient documentation

## 2012-08-04 DIAGNOSIS — I10 Essential (primary) hypertension: Secondary | ICD-10-CM | POA: Insufficient documentation

## 2012-08-04 DIAGNOSIS — S88119A Complete traumatic amputation at level between knee and ankle, unspecified lower leg, initial encounter: Secondary | ICD-10-CM | POA: Insufficient documentation

## 2012-08-05 NOTE — H&P (Signed)
[name], [name]             ACCOUNT NO.:  [id number]  MEDICAL RECORD NO.:  [id number]  LOCATION:  FOOT                         FACILITY:  [facility]  PHYSICIAN:  Joanne Gavel, M.D.        DATE OF BIRTH:  1927-09-18  DATE OF ADMISSION:  08/04/2012 DATE OF DISCHARGE:                             HISTORY & PHYSICAL   CHIEF COMPLAINT:  Wound, right heel.  HISTORY OF PRESENT ILLNESS:  An 79 year old female with dementia and multiple medical problems, is bed ridden.  She is status post left BKA, so on the right heel was noticed in the middle of May.  The scab fell off the sore resulting in a sizable wound.  The patient has been treated at the [facility] for 2 weeks, and the family thinks the wound is better.  The patient was seen by a vascular surgeon, who did a vascular workup, says that there is nothing that can be re-mediated and that even local treatment will be successful if the patient will require an amputation.  The family does not want to have an amputation if this can be avoided.  PAST MEDICAL HISTORY:  Significant for: 1. Congestive heart failure. 2. Atrial fibrillation. 3. Hypertension. 4. Stroke. 5. Anemia. 6. Kidney disease. 7. Kidney stones. 8. Pancreatitis. 9. Urinary tract infection.  PAST SURGICAL HISTORY:  Above knee amputation 2007 on the left side, cholecystectomy, tubal ligation, and a skin graft on the left leg.  SOCIAL HISTORY:  Cigarettes none.  Alcohol none.  MEDICATIONS:  Amlodipine, atorvastatin, bisacodyl, carvedilol, Cipro 500 b.i.d., Celexa, Plavix, famotidine, ferrous sulfate, gabapentin, hydrocodone hydroxyzine, levalbuterol, Ativan, Zofran, potassium chloride, Risperdal, tramadol, and vitamin D.  ALLERGIES:  PENICILLIN and IODINE.  REVIEW OF SYSTEMS:  As above.  PHYSICAL EXAMINATION:  VITAL SIGNS:  Temperature 98.1, pulse 88, respirations 16, blood pressure 157/88.  GENERAL APPEARANCE:  Well developed, well nourished, status post  left-sided amputation.  The patient is disoriented to time and place. EXTREMITIES:  Examination of the right foot reveals an ABI of 7.76. There is a very superficial right plantar ulcer 0.8 x 0.8.  This is covered with scab.  The right heel there is a 2.0 x 2.4 x 0.9 wound. This probes to the calcaneus.  IMPRESSION:  Decubitus wound, right heel and right plantar foot, possible osteo.  We will start with debridement.  The wound is now fairly clean, and we will use Puracol and consider a VAC. Family is aware that if the bone is infected the patient will probably have to have an amputation.  We will see her in 7 days.     Joanne Gavel, M.D.     RA/MEDQ  D:  08/04/2012  T:  08/05/2012  Job:  [id number]

## 2012-08-11 ENCOUNTER — Encounter (HOSPITAL_BASED_OUTPATIENT_CLINIC_OR_DEPARTMENT_OTHER): Payer: PRIVATE HEALTH INSURANCE | Attending: General Surgery

## 2012-08-11 DIAGNOSIS — L8994 Pressure ulcer of unspecified site, stage 4: Secondary | ICD-10-CM | POA: Insufficient documentation

## 2012-08-11 DIAGNOSIS — L89609 Pressure ulcer of unspecified heel, unspecified stage: Secondary | ICD-10-CM | POA: Insufficient documentation

## 2012-09-15 ENCOUNTER — Encounter (HOSPITAL_BASED_OUTPATIENT_CLINIC_OR_DEPARTMENT_OTHER): Payer: PRIVATE HEALTH INSURANCE | Attending: General Surgery

## 2012-09-15 DIAGNOSIS — L89899 Pressure ulcer of other site, unspecified stage: Secondary | ICD-10-CM | POA: Insufficient documentation

## 2012-09-15 DIAGNOSIS — S78119A Complete traumatic amputation at level between unspecified hip and knee, initial encounter: Secondary | ICD-10-CM | POA: Insufficient documentation

## 2012-09-15 DIAGNOSIS — L8993 Pressure ulcer of unspecified site, stage 3: Secondary | ICD-10-CM | POA: Insufficient documentation

## 2012-09-15 DIAGNOSIS — L89609 Pressure ulcer of unspecified heel, unspecified stage: Secondary | ICD-10-CM | POA: Insufficient documentation

## 2012-10-13 ENCOUNTER — Encounter (HOSPITAL_BASED_OUTPATIENT_CLINIC_OR_DEPARTMENT_OTHER): Payer: PRIVATE HEALTH INSURANCE | Attending: General Surgery

## 2012-10-13 DIAGNOSIS — L89899 Pressure ulcer of other site, unspecified stage: Secondary | ICD-10-CM | POA: Insufficient documentation

## 2012-10-13 DIAGNOSIS — L8993 Pressure ulcer of unspecified site, stage 3: Secondary | ICD-10-CM | POA: Insufficient documentation

## 2012-11-10 ENCOUNTER — Encounter (HOSPITAL_BASED_OUTPATIENT_CLINIC_OR_DEPARTMENT_OTHER): Payer: PRIVATE HEALTH INSURANCE | Attending: General Surgery

## 2012-11-10 DIAGNOSIS — L8993 Pressure ulcer of unspecified site, stage 3: Secondary | ICD-10-CM | POA: Insufficient documentation

## 2012-11-10 DIAGNOSIS — L89899 Pressure ulcer of other site, unspecified stage: Secondary | ICD-10-CM | POA: Insufficient documentation

## 2012-12-08 ENCOUNTER — Encounter (HOSPITAL_BASED_OUTPATIENT_CLINIC_OR_DEPARTMENT_OTHER): Payer: PRIVATE HEALTH INSURANCE | Attending: General Surgery

## 2012-12-08 DIAGNOSIS — L89609 Pressure ulcer of unspecified heel, unspecified stage: Secondary | ICD-10-CM | POA: Insufficient documentation

## 2012-12-08 DIAGNOSIS — L8993 Pressure ulcer of unspecified site, stage 3: Secondary | ICD-10-CM | POA: Insufficient documentation

## 2013-01-12 ENCOUNTER — Encounter (HOSPITAL_BASED_OUTPATIENT_CLINIC_OR_DEPARTMENT_OTHER): Payer: PRIVATE HEALTH INSURANCE | Attending: General Surgery

## 2013-01-12 DIAGNOSIS — L8993 Pressure ulcer of unspecified site, stage 3: Secondary | ICD-10-CM | POA: Insufficient documentation

## 2013-01-12 DIAGNOSIS — L89609 Pressure ulcer of unspecified heel, unspecified stage: Secondary | ICD-10-CM | POA: Insufficient documentation

## 2013-09-10 ENCOUNTER — Emergency Department (HOSPITAL_COMMUNITY): Payer: PRIVATE HEALTH INSURANCE

## 2013-09-10 ENCOUNTER — Inpatient Hospital Stay (HOSPITAL_COMMUNITY): Payer: PRIVATE HEALTH INSURANCE

## 2013-09-10 ENCOUNTER — Encounter (HOSPITAL_COMMUNITY): Payer: Self-pay | Admitting: Emergency Medicine

## 2013-09-10 ENCOUNTER — Inpatient Hospital Stay (HOSPITAL_COMMUNITY)
Admission: EM | Admit: 2013-09-10 | Discharge: 2013-09-16 | DRG: 871 | Disposition: A | Discharge status: Hospice | Payer: PRIVATE HEALTH INSURANCE | Attending: Internal Medicine | Admitting: Internal Medicine

## 2013-09-10 DIAGNOSIS — IMO0002 Reserved for concepts with insufficient information to code with codable children: Secondary | ICD-10-CM | POA: Diagnosis present

## 2013-09-10 DIAGNOSIS — R1310 Dysphagia, unspecified: Secondary | ICD-10-CM | POA: Diagnosis present

## 2013-09-10 DIAGNOSIS — Z791 Long term (current) use of non-steroidal anti-inflammatories (NSAID): Secondary | ICD-10-CM | POA: Diagnosis not present

## 2013-09-10 DIAGNOSIS — E46 Unspecified protein-calorie malnutrition: Secondary | ICD-10-CM | POA: Diagnosis present

## 2013-09-10 DIAGNOSIS — N179 Acute kidney failure, unspecified: Secondary | ICD-10-CM | POA: Diagnosis present

## 2013-09-10 DIAGNOSIS — D696 Thrombocytopenia, unspecified: Secondary | ICD-10-CM | POA: Diagnosis present

## 2013-09-10 DIAGNOSIS — N183 Chronic kidney disease, stage 3 unspecified: Secondary | ICD-10-CM | POA: Diagnosis present

## 2013-09-10 DIAGNOSIS — I739 Peripheral vascular disease, unspecified: Secondary | ICD-10-CM | POA: Diagnosis present

## 2013-09-10 DIAGNOSIS — E872 Acidosis, unspecified: Secondary | ICD-10-CM | POA: Diagnosis present

## 2013-09-10 DIAGNOSIS — I6789 Other cerebrovascular disease: Secondary | ICD-10-CM | POA: Diagnosis present

## 2013-09-10 DIAGNOSIS — J9600 Acute respiratory failure, unspecified whether with hypoxia or hypercapnia: Secondary | ICD-10-CM | POA: Diagnosis present

## 2013-09-10 DIAGNOSIS — I129 Hypertensive chronic kidney disease with stage 1 through stage 4 chronic kidney disease, or unspecified chronic kidney disease: Secondary | ICD-10-CM | POA: Diagnosis present

## 2013-09-10 DIAGNOSIS — I5023 Acute on chronic systolic (congestive) heart failure: Secondary | ICD-10-CM | POA: Diagnosis present

## 2013-09-10 DIAGNOSIS — S78119A Complete traumatic amputation at level between unspecified hip and knee, initial encounter: Secondary | ICD-10-CM

## 2013-09-10 DIAGNOSIS — G3183 Dementia with Lewy bodies: Secondary | ICD-10-CM | POA: Diagnosis present

## 2013-09-10 DIAGNOSIS — G9340 Encephalopathy, unspecified: Secondary | ICD-10-CM | POA: Diagnosis present

## 2013-09-10 DIAGNOSIS — G20A1 Parkinson's disease without dyskinesia, without mention of fluctuations: Secondary | ICD-10-CM | POA: Diagnosis present

## 2013-09-10 DIAGNOSIS — L89109 Pressure ulcer of unspecified part of back, unspecified stage: Secondary | ICD-10-CM | POA: Diagnosis present

## 2013-09-10 DIAGNOSIS — Z66 Do not resuscitate: Secondary | ICD-10-CM | POA: Diagnosis present

## 2013-09-10 DIAGNOSIS — I639 Cerebral infarction, unspecified: Secondary | ICD-10-CM | POA: Diagnosis present

## 2013-09-10 DIAGNOSIS — S88119A Complete traumatic amputation at level between knee and ankle, unspecified lower leg, initial encounter: Secondary | ICD-10-CM

## 2013-09-10 DIAGNOSIS — T17408A Unspecified foreign body in trachea causing other injury, initial encounter: Secondary | ICD-10-CM | POA: Diagnosis present

## 2013-09-10 DIAGNOSIS — R6520 Severe sepsis without septic shock: Secondary | ICD-10-CM | POA: Diagnosis present

## 2013-09-10 DIAGNOSIS — D509 Iron deficiency anemia, unspecified: Secondary | ICD-10-CM | POA: Diagnosis present

## 2013-09-10 DIAGNOSIS — A419 Sepsis, unspecified organism: Secondary | ICD-10-CM | POA: Diagnosis present

## 2013-09-10 DIAGNOSIS — Z7902 Long term (current) use of antithrombotics/antiplatelets: Secondary | ICD-10-CM | POA: Diagnosis not present

## 2013-09-10 DIAGNOSIS — Z880 Allergy status to penicillin: Secondary | ICD-10-CM

## 2013-09-10 DIAGNOSIS — L8994 Pressure ulcer of unspecified site, stage 4: Secondary | ICD-10-CM | POA: Diagnosis present

## 2013-09-10 DIAGNOSIS — Z515 Encounter for palliative care: Secondary | ICD-10-CM

## 2013-09-10 DIAGNOSIS — J9601 Acute respiratory failure with hypoxia: Secondary | ICD-10-CM

## 2013-09-10 DIAGNOSIS — T17808A Unspecified foreign body in other parts of respiratory tract causing other injury, initial encounter: Secondary | ICD-10-CM

## 2013-09-10 DIAGNOSIS — L89609 Pressure ulcer of unspecified heel, unspecified stage: Secondary | ICD-10-CM | POA: Diagnosis present

## 2013-09-10 DIAGNOSIS — J690 Pneumonitis due to inhalation of food and vomit: Secondary | ICD-10-CM | POA: Diagnosis present

## 2013-09-10 DIAGNOSIS — L8993 Pressure ulcer of unspecified site, stage 3: Secondary | ICD-10-CM | POA: Diagnosis present

## 2013-09-10 DIAGNOSIS — I4891 Unspecified atrial fibrillation: Secondary | ICD-10-CM | POA: Diagnosis present

## 2013-09-10 DIAGNOSIS — G309 Alzheimer's disease, unspecified: Secondary | ICD-10-CM

## 2013-09-10 DIAGNOSIS — R0600 Dyspnea, unspecified: Secondary | ICD-10-CM | POA: Diagnosis present

## 2013-09-10 DIAGNOSIS — Z8673 Personal history of transient ischemic attack (TIA), and cerebral infarction without residual deficits: Secondary | ICD-10-CM

## 2013-09-10 DIAGNOSIS — I509 Heart failure, unspecified: Secondary | ICD-10-CM | POA: Diagnosis present

## 2013-09-10 DIAGNOSIS — Z91041 Radiographic dye allergy status: Secondary | ICD-10-CM | POA: Diagnosis not present

## 2013-09-10 DIAGNOSIS — R531 Weakness: Secondary | ICD-10-CM | POA: Diagnosis present

## 2013-09-10 DIAGNOSIS — G20 Parkinson's disease: Secondary | ICD-10-CM | POA: Diagnosis present

## 2013-09-10 DIAGNOSIS — F0280 Dementia in other diseases classified elsewhere without behavioral disturbance: Secondary | ICD-10-CM | POA: Diagnosis present

## 2013-09-10 DIAGNOSIS — T17508A Unspecified foreign body in bronchus causing other injury, initial encounter: Secondary | ICD-10-CM

## 2013-09-10 DIAGNOSIS — R0602 Shortness of breath: Secondary | ICD-10-CM | POA: Diagnosis present

## 2013-09-10 DIAGNOSIS — J189 Pneumonia, unspecified organism: Secondary | ICD-10-CM

## 2013-09-10 LAB — I-STAT ARTERIAL BLOOD GAS, ED
ACID-BASE DEFICIT: 8 mmol/L — AB (ref 0.0–2.0)
Bicarbonate: 18.2 mEq/L — ABNORMAL LOW (ref 20.0–24.0)
O2 SAT: 91 %
PCO2 ART: 37.7 mmHg (ref 35.0–45.0)
PO2 ART: 67 mmHg — AB (ref 80.0–100.0)
TCO2: 19 mmol/L (ref 0–100)
pH, Arterial: 7.292 — ABNORMAL LOW (ref 7.350–7.450)

## 2013-09-10 LAB — URINALYSIS, ROUTINE W REFLEX MICROSCOPIC
Bilirubin Urine: NEGATIVE
Glucose, UA: NEGATIVE mg/dL
Hgb urine dipstick: NEGATIVE
KETONES UR: NEGATIVE mg/dL
Leukocytes, UA: NEGATIVE
NITRITE: NEGATIVE
PH: 5.5 (ref 5.0–8.0)
Protein, ur: >300 mg/dL — AB
Specific Gravity, Urine: 1.015 (ref 1.005–1.030)
UROBILINOGEN UA: 0.2 mg/dL (ref 0.0–1.0)

## 2013-09-10 LAB — PHOSPHORUS: PHOSPHORUS: 3.7 mg/dL (ref 2.3–4.6)

## 2013-09-10 LAB — BASIC METABOLIC PANEL
Anion gap: 16 — ABNORMAL HIGH (ref 5–15)
BUN: 43 mg/dL — AB (ref 6–23)
CO2: 16 mEq/L — ABNORMAL LOW (ref 19–32)
Calcium: 9.1 mg/dL (ref 8.4–10.5)
Chloride: 109 mEq/L (ref 96–112)
Creatinine, Ser: 3.16 mg/dL — ABNORMAL HIGH (ref 0.50–1.10)
GFR calc Af Amer: 15 mL/min — ABNORMAL LOW (ref >90)
GFR calc non Af Amer: 13 mL/min — ABNORMAL LOW (ref >90)
GLUCOSE: 113 mg/dL — AB (ref 70–99)
POTASSIUM: 4.9 meq/L (ref 3.7–5.3)
Sodium: 141 mEq/L (ref 137–147)

## 2013-09-10 LAB — CBC WITH DIFFERENTIAL/PLATELET
Basophils Absolute: 0 10*3/uL (ref 0.0–0.1)
Basophils Relative: 0 % (ref 0–1)
Eosinophils Absolute: 0.1 10*3/uL (ref 0.0–0.7)
Eosinophils Relative: 1 % (ref 0–5)
HCT: 33.3 % — ABNORMAL LOW (ref 36.0–46.0)
Hemoglobin: 10.9 g/dL — ABNORMAL LOW (ref 12.0–15.0)
LYMPHS ABS: 0.5 10*3/uL — AB (ref 0.7–4.0)
Lymphocytes Relative: 7 % — ABNORMAL LOW (ref 12–46)
MCH: 27 pg (ref 26.0–34.0)
MCHC: 32.7 g/dL (ref 30.0–36.0)
MCV: 82.4 fL (ref 78.0–100.0)
Monocytes Absolute: 0.2 10*3/uL (ref 0.1–1.0)
Monocytes Relative: 3 % (ref 3–12)
NEUTROS ABS: 6.6 10*3/uL (ref 1.7–7.7)
NEUTROS PCT: 89 % — AB (ref 43–77)
Platelets: 129 10*3/uL — ABNORMAL LOW (ref 150–400)
RBC: 4.04 MIL/uL (ref 3.87–5.11)
RDW: 14.3 % (ref 11.5–15.5)
WBC: 7.5 10*3/uL (ref 4.0–10.5)

## 2013-09-10 LAB — URINE MICROSCOPIC-ADD ON

## 2013-09-10 LAB — CBG MONITORING, ED: Glucose-Capillary: 95 mg/dL (ref 70–99)

## 2013-09-10 LAB — TROPONIN I: Troponin I: <0.3 ng/mL (ref <0.30)

## 2013-09-10 LAB — STREP PNEUMONIAE URINARY ANTIGEN: Strep Pneumo Urinary Antigen: NEGATIVE

## 2013-09-10 LAB — APTT: aPTT: 23 seconds — ABNORMAL LOW (ref 24–37)

## 2013-09-10 LAB — PROTIME-INR
INR: 1.1 (ref 0.00–1.49)
PROTHROMBIN TIME: 14.2 s (ref 11.6–15.2)

## 2013-09-10 LAB — GLUCOSE, CAPILLARY
GLUCOSE-CAPILLARY: 118 mg/dL — AB (ref 70–99)
GLUCOSE-CAPILLARY: 103 mg/dL — AB (ref 70–99)
Glucose-Capillary: 97 mg/dL (ref 70–99)

## 2013-09-10 LAB — PRO B NATRIURETIC PEPTIDE: PRO B NATRI PEPTIDE: 5121 pg/mL — AB (ref 0–450)

## 2013-09-10 LAB — LACTIC ACID, PLASMA: Lactic Acid, Venous: 1.2 mmol/L (ref 0.5–2.2)

## 2013-09-10 LAB — MAGNESIUM: MAGNESIUM: 1.6 mg/dL (ref 1.5–2.5)

## 2013-09-10 LAB — I-STAT CG4 LACTIC ACID, ED: Lactic Acid, Venous: 1.5 mmol/L (ref 0.5–2.2)

## 2013-09-10 LAB — PROCALCITONIN: PROCALCITONIN: 22.08 ng/mL

## 2013-09-10 MED ORDER — AMANTADINE HCL 100 MG PO CAPS
100.0000 mg | ORAL_CAPSULE | Freq: Every day | ORAL | Status: DC
  Filled 2013-09-10 (×6): qty 1

## 2013-09-10 MED ORDER — ALBUTEROL SULFATE (2.5 MG/3ML) 0.083% IN NEBU
2.5000 mg | INHALATION_SOLUTION | RESPIRATORY_TRACT | Status: DC
  Administered 2013-09-10 – 2013-09-16 (×34): 2.5 mg via RESPIRATORY_TRACT
  Filled 2013-09-10 (×37): qty 3

## 2013-09-10 MED ORDER — INSULIN ASPART 100 UNIT/ML ~~LOC~~ SOLN
0.0000 [IU] | SUBCUTANEOUS | Status: DC
  Administered 2013-09-12: 1 [IU] via SUBCUTANEOUS
  Administered 2013-09-12: 2 [IU] via SUBCUTANEOUS
  Administered 2013-09-12 – 2013-09-13 (×4): 1 [IU] via SUBCUTANEOUS

## 2013-09-10 MED ORDER — HEPARIN SODIUM (PORCINE) 5000 UNIT/ML IJ SOLN
5000.0000 [IU] | Freq: Three times a day (TID) | INTRAMUSCULAR | Status: DC
  Administered 2013-09-10 – 2013-09-13 (×9): 5000 [IU] via SUBCUTANEOUS
  Filled 2013-09-10 (×9): qty 1

## 2013-09-10 MED ORDER — VANCOMYCIN HCL IN DEXTROSE 1-5 GM/200ML-% IV SOLN
1000.0000 mg | Freq: Once | INTRAVENOUS | Status: AC
  Administered 2013-09-10: 1000 mg via INTRAVENOUS
  Filled 2013-09-10: qty 200

## 2013-09-10 MED ORDER — PANTOPRAZOLE SODIUM 40 MG IV SOLR
40.0000 mg | Freq: Every day | INTRAVENOUS | Status: DC
  Administered 2013-09-10 – 2013-09-12 (×3): 40 mg via INTRAVENOUS
  Filled 2013-09-10 (×4): qty 40

## 2013-09-10 MED ORDER — VANCOMYCIN HCL IN DEXTROSE 1-5 GM/200ML-% IV SOLN
1000.0000 mg | INTRAVENOUS | Status: DC
  Administered 2013-09-12 – 2013-09-14 (×2): 1000 mg via INTRAVENOUS
  Filled 2013-09-10 (×2): qty 200

## 2013-09-10 MED ORDER — DEXTROSE 5 % IV SOLN
1.0000 g | Freq: Two times a day (BID) | INTRAVENOUS | Status: DC
  Administered 2013-09-10: 1 g via INTRAVENOUS
  Filled 2013-09-10: qty 1

## 2013-09-10 MED ORDER — SODIUM CHLORIDE 0.9 % IJ SOLN
10.0000 mL | Freq: Two times a day (BID) | INTRAMUSCULAR | Status: DC
  Administered 2013-09-10 – 2013-09-15 (×8): 10 mL

## 2013-09-10 MED ORDER — SODIUM CHLORIDE 0.9 % IV SOLN
250.0000 mg | Freq: Two times a day (BID) | INTRAVENOUS | Status: DC
  Administered 2013-09-10 – 2013-09-14 (×9): 250 mg via INTRAVENOUS
  Filled 2013-09-10 (×15): qty 250

## 2013-09-10 MED ORDER — ONDANSETRON HCL 4 MG/2ML IJ SOLN
4.0000 mg | Freq: Once | INTRAMUSCULAR | Status: AC
  Administered 2013-09-10: 4 mg via INTRAVENOUS
  Filled 2013-09-10: qty 2

## 2013-09-10 MED ORDER — SODIUM CHLORIDE 0.9 % IV SOLN: Freq: Once | INTRAVENOUS | Status: DC

## 2013-09-10 MED ORDER — SODIUM BICARBONATE 8.4 % IV SOLN
INTRAVENOUS | Status: DC
  Administered 2013-09-10 – 2013-09-11 (×2): via INTRAVENOUS
  Filled 2013-09-10 (×6): qty 150

## 2013-09-10 MED ORDER — SODIUM CHLORIDE 0.9 % IJ SOLN: 10.0000 mL | INTRAMUSCULAR | Status: DC | PRN

## 2013-09-10 NOTE — ED Notes (Signed)
PICC line insertion nurse at bedside

## 2013-09-10 NOTE — H&P (Signed)
Name: [name] MRN: [id number] DOB: Dec 18, 1927    ADMISSION DATE:  09/10/2013 CONSULTATION DATE:  9/4 PRIMARY SERVICE: PCCM-->[facility]  CHIEF COMPLAINT:   Sepsis, pneumonia and respiratory failure   BRIEF PATIENT DESCRIPTION:  80 year old female, resides at SNF. H/o af, severe PAD, PD w/ dementia, prob dysphagia and recent PNA. Admitted from SNF on 9/4 w/ working dx of acute resp failure in setting of HCAP vs aspiration    SIGNIFICANT EVENTS / STUDIES:  9/4 admitted to SDU for prob aspiration pneumonia. Goals of care discussion w/ daughter. Full DNR. BIPAP, IVFs, antibiotics. If no improvement w/ in 48 hrs would be open to comfort oriented care. Daghter's contacts: [name] [phone]  LINES / TUBES:   CULTURES: BCX2 9/4>>> UC 89/4>>> u strep 9/4>>> u legionella 9/4>>  ANTIBIOTICS: primaxin 9/4>>> Vanc 9/4>>>  HISTORY OF PRESENT ILLNESS:  80 year old female w/ h/o diastolic dysfxn, dementia (likely PD related), prior CVA requiring assist w/ ADLs since 2012, resides at [facility]. Presented to ER on 9/4 from the SNF w/ report of increased WOB. On presentation she was lethargic, found to have marked left > Right airspace disease, metabolic acidosis and acute on chronic renal failure. Per daughter was in usual state of health the night prior. Does report pocketing of food, she is on a modified diet and has had recent pneumonia leading to high probability of dysphagia/ aspiration. Will be admitted to the SDU and treated for HCAP/Aspiration. Goals of care discussed. PT full DNR   PAST MEDICAL HISTORY :  Past Medical History  Diagnosis Date  . CVA (cerebral infarction)   . Diastolic heart failure   . Anemia   . Atrial fibrillation   . Acute on chronic kidney disease, stage 3   . Hypertension   . Hx of BKA   . Kidney stones   . Pancreatitis   . Urinary tract infection    Past Surgical History  Procedure Laterality Date  . Below knee leg amputation     Prior to  Admission medications   Medication Sig Start Date End Date Taking? Authorizing Provider  amantadine (SYMMETREL) 100 MG capsule Take 100 mg by mouth daily.   Yes Historical Provider, MD  amLODipine (NORVASC) 10 MG tablet Take 10 mg by mouth daily.     Yes Historical Provider, MD  bisacodyl (DULCOLAX) 10 MG suppository Place 10 mg rectally daily as needed. constipation    Yes Historical Provider, MD  camphor-menthol (SARNA) lotion Apply 1 application topically 4 (four) times daily as needed for itching.    Yes Historical Provider, MD  carvedilol (COREG) 12.5 MG tablet Take 12.5 mg by mouth 2 (two) times daily with a meal.   Yes Historical Provider, MD  cloNIDine (CATAPRES) 0.1 MG tablet Take 0.1 mg by mouth daily as needed (for SBP greater than 180).   Yes Historical Provider, MD  clopidogrel (PLAVIX) 75 MG tablet Take 75 mg by mouth daily.     Yes Historical Provider, MD  Cranberry 425 MG CAPS Take 1 capsule by mouth daily.   Yes Historical Provider, MD  diphenhydrAMINE (BENADRYL) 12.5 MG/5ML liquid Take 12.5 mg by mouth 2 (two) times daily as needed. allergies    Yes Historical Provider, MD  ferrous sulfate 325 (65 FE) MG tablet Take 325 mg by mouth daily with breakfast.     Yes Historical Provider, MD  furosemide (LASIX) 20 MG tablet Take 20 mg by mouth 3 (three) times a week. On Mon, Wed,  Fri   Yes Historical Provider, MD  gabapentin (NEURONTIN) 300 MG capsule Take 300 mg by mouth 2 (two) times daily.    Yes Historical Provider, MD  HYDROcodone-acetaminophen (NORCO) 5-325 MG per tablet Take 1 tablet by mouth every 4 (four) hours as needed. Mild to severe pain   Yes Historical Provider, MD  ipratropium-albuterol (DUONEB) 0.5-2.5 (3) MG/3ML SOLN Take 3 mLs by nebulization every 6 (six) hours as needed (for congestion).   Yes Historical Provider, MD  levalbuterol [facility] HFA) 45 MCG/ACT inhaler Inhale 2 puffs into the lungs every 6 (six) hours as needed. dyspnea    Yes Historical Provider, MD    loratadine (CLARITIN) 10 MG tablet Take 10 mg by mouth daily as needed for itching.   Yes Historical Provider, MD  LORazepam (ATIVAN) 0.5 MG tablet Take 0.5 mg by mouth at bedtime. For agitation   Yes Historical Provider, MD  LORazepam (ATIVAN) 0.5 MG tablet Take 0.5 mg by mouth 2 (two) times daily as needed. For agitation   Yes Historical Provider, MD  Multiple Vitamins-Minerals (MULTIVITAMINS THER. W/MINERALS) TABS Take 1 tablet by mouth daily.     Yes Historical Provider, MD  nitroGLYCERIN (NITROSTAT) 0.4 MG SL tablet Place 0.4 mg under the tongue every 5 (five) minutes as needed for chest pain.   Yes Historical Provider, MD  Olopatadine HCl (PATADAY) 0.2 % SOLN Place 1 drop into both eyes daily.   Yes Historical Provider, MD  ondansetron (ZOFRAN) 4 MG tablet Take 1 tablet (4 mg total) by mouth every 6 (six) hours as needed for nausea. 09/24/11  Yes Gavin Pound. [name], MD  pantoprazole (PROTONIX) 40 MG tablet Take 40 mg by mouth daily.   Yes Historical Provider, MD  polyvinyl alcohol (LIQUIFILM TEARS) 1.4 % ophthalmic solution Place 1 drop into both eyes 4 (four) times daily as needed for dry eyes.    Yes Historical Provider, MD  potassium chloride SA (K-DUR,KLOR-CON) 20 MEQ tablet Take 20 mEq by mouth 2 (two) times daily.   Yes Historical Provider, MD  risperiDONE (RISPERDAL) 1 MG tablet Take 1 mg by mouth 2 (two) times daily.   Yes Historical Provider, MD  senna-docusate (SENOKOT-S) 8.6-50 MG per tablet Take 1 tablet by mouth 2 (two) times daily as needed for moderate constipation.    Yes Historical Provider, MD  sertraline (ZOLOFT) 25 MG tablet Take 25 mg by mouth daily.   Yes Historical Provider, MD  sodium chloride (OCEAN) 0.65 % SOLN nasal spray Place 1 spray into both nostrils every 8 (eight) hours as needed (for dry nose).   Yes Historical Provider, MD  traMADol (ULTRAM) 50 MG tablet Take 50 mg by mouth every 8 (eight) hours as needed. Pain. Maximum dose= 8 tablets per day    Yes Historical  Provider, MD  vitamin C (ASCORBIC ACID) 500 MG tablet Take 500 mg by mouth 2 (two) times daily.   Yes Historical Provider, MD  Vitamin D, Ergocalciferol, (DRISDOL) 50000 UNITS CAPS Take 50,000 Units by mouth daily. For 30 days starting on 08/21/13   Yes Historical Provider, MD   Allergies  Allergen Reactions  . Iodinated Diagnostic Agents     Per MAR  . Penicillins     Per  MAR    FAMILY HISTORY:  No family history on file. SOCIAL HISTORY:  reports that she has never smoked. She has never used smokeless tobacco. She reports that she does not drink alcohol or use illicit drugs.  REVIEW OF SYSTEMS:   Unable  SUBJECTIVE:  Appears comfortable on BIPAP  VITAL SIGNS: Temp:  [99.1 F (37.3 C)] 99.1 F (37.3 C) (09/04 0800) Pulse Rate:  [64-73] 64 (09/04 1030) Resp:  [19-28] 23 (09/04 1030) BP: (159-176)/(48-67) 166/59 mmHg (09/04 1030) SpO2:  [91 %-98 %] 97 % (09/04 1030)  PHYSICAL EXAMINATION: General:  Chronically ill appearing white female.  Neuro:  Arouses to voice. Can't move left. Right upper is weak, HEENT:  BIPAP mask in place  Cardiovascular:  Reg irreg  Lungs:  Scattered rhonchi  Abdomen:  Soft, nontender  Musculoskeletal:  left AKA.  Skin:  Sacral decub    PULMONARY  Recent Labs Lab 09/10/13 1036  PHART 7.292*  PCO2ART 37.7  PO2ART 67.0*  HCO3 18.2*  TCO2 19  O2SAT 91.0    CBC  Recent Labs Lab 09/10/13 0830  HGB 10.9*  HCT 33.3*  WBC 7.5  PLT 129*    COAGULATION  Recent Labs Lab 09/10/13 0830  INR 1.10    CARDIAC   Recent Labs Lab 09/10/13 0830  TROPONINI <0.30    Recent Labs Lab 09/10/13 0830  PROBNP 5121.0*     CHEMISTRY  Recent Labs Lab 09/10/13 0830  NA 141  K 4.9  CL 109  CO2 16*  GLUCOSE 113*  BUN 43*  CREATININE 3.16*  CALCIUM 9.1   The CrCl is unknown because both a height and weight (above a minimum accepted value) are required for this calculation.   LIVER  Recent Labs Lab 09/10/13 0830  INR  1.10     INFECTIOUS  Recent Labs Lab 09/10/13 1052  LATICACIDVEN 1.50     ENDOCRINE CBG (last 3)  No results found for this basename: GLUCAP,  in the last 72 hours       IMAGING x48h No results found.    ASSESSMENT / PLAN:   Acute respiratory failure in the setting of bilateral L>R pneumonia: aspiration vs HCAP Have had long discussion w/ daughter. This is a recurrent pneumonia. Has what sounds like dysphagia and food pocketing in the SNF. Have established goals of care. She would Not want intubation.  Plan Admit to SDU BIPAP up to 48 hrs. If no improvement would begin to discuss comfort oriented care Send urine antigens for strep and legionella PCT protocol NPO except meds Empiric HCAP coverage Scheduled BDs Needs SLP eval if improves.    H/o atrial fib H/o Diastolic dysfxn H/o HTN H/o severe PVD SIRS/sepsis in setting of HCAP  Plan Admit to SDU Ck lactic acid IVFs Tele Add back antihypertensives if ms will allow, otherwise PRN IVs for now  No escalation further than   Acute on chronic renal failure  Plan Renal dose meds IVFs F/u chemistry  Check UC and UA  +anion gap/NAP  Metabolic acidosis: in setting of sepsis and renal failure  Plan Admit to SDU Bicarb gtt Close obs chemistry   Acute encephalopathy superimposed on underlying dementia ? parkinsons dz  Plan Hold all sedating meds Supportive care Cont symmetrel if able   Sacral decub ulcer Plan WOC consult  Dysphagia and protein cal malnutrition Plan NPO SLP eval if MS improved May need to consider tubefeeds  Iron def anemia and thrombocytopenia  Plan Transfuse for hgb <7 Trend cbc Add back feso4 when ms improved   Admit to SDU Full DNR IVFs, ABX, BIPAP 48hrs if needed.  [facility] to assume care 9/5   Anders Simmonds NP Pulmonary and Critical Care Medicine [facility] Pager: [phone]  09/10/2013, 11:53 AM  STAFF NOTE: I, Dr Lavinia Sharps have  personally reviewed patient's available data, including medical history, events of note, physical examination and test results as part of my evaluation. I have discussed with resident/NP and other care providers such as pharmacist, RN and RRT.  In addition,  I personally evaluated patient and elicited key findings of acute respiratory failure due to aspiration v HCAP pneumonia but also acute renal failure with metabolic acidosis. Due to her fraility, scoliosis and pneumonia she has poor respiratory compensation of the metabolic acidosis making her high risk for respiratory arrest. Will temporize with bipap. DNR/DNI status confirmed by NP. PAtient will go to SDU. TRH to assumer primary 09/11/13. If she decompensates, concurrent palliation needs to be adopted. High risk for this being her terminal illness  Rest per NP/medical resident whose note is outlined above and that I agree with  The patient is critically ill with multiple organ systems failure and requires high complexity decision making for assessment and support, frequent evaluation and titration of therapies, application of advanced monitoring technologies and extensive interpretation of multiple databases.   Critical Care Time devoted to patient care services described in this note is  35  Minutes.  Dr. Kalman Shan, M.D., [facility].C.P Pulmonary and Critical Care Medicine Staff Physician [facility] Pager: [phone], If no answer or between  15:00h - 7:00h: call [phone]  09/10/2013 12:36 PM

## 2013-09-10 NOTE — ED Notes (Addendum)
Patient states that she is nauseated.  RT at bedside, removed BIPAP, patient maintaining 91%. Zofran administered

## 2013-09-10 NOTE — Progress Notes (Signed)
Pt arrived from ED-on Bipap-VSS. Family stepped away will be back by later. Applied sacral dsg  Started order IVF and drew outstanding labs. Will continue to monitor for changes

## 2013-09-10 NOTE — ED Notes (Signed)
Respiratory at bedside.

## 2013-09-10 NOTE — ED Notes (Signed)
Patient has pressure ulcer near above her sacrum

## 2013-09-10 NOTE — Progress Notes (Signed)
Bipap removed due to being nauseated.  Placed on 4l [place].  RN aware.

## 2013-09-10 NOTE — Progress Notes (Signed)
eLink Physician-Brief Progress Note Patient Name: [name] DOB: 1927/12/18 MRN: [id number]   Date of Service  09/10/2013  HPI/Events of Note  Call from RN for family discuss code status Family initially wished to change code status to DNI but CPR OK; I discussed with them at length and explained that their mother has pneumonia causing multi-organ failure and multiple comorbid illnesses, so her overall prognosis is poor.  Further, I explained that CPR would break her ribs and would cause pain and suffering.  After our conversation they asked that we maintain code status DNR.  eICU Interventions  Maintain code status DNR     Intervention Category Major Interventions: End of life / care limitation discussion;Respiratory failure - evaluation and management  Netta Fodge 09/10/2013, 5:44 PM

## 2013-09-10 NOTE — ED Notes (Addendum)
Per EMS: resp distress from nursing home hx of chf, nursing facility staff gave 5 albuterol before ems arrival, upon ems arrival, patient was found to have rales, crackles, started on CPAP, 12 lead unremarkable, RT at bedside, unable to start IV en route, 91% on biPaP, 20 etCo2, 5 albuterol, 38-40 rr, hx of CVA on left side

## 2013-09-10 NOTE — Progress Notes (Signed)
ANTIBIOTIC CONSULT NOTE - INITIAL  Pharmacy Consult for vancomycin and imipenem Indication: pneumonia  Allergies  Allergen Reactions  . Iodinated Diagnostic Agents     Per MAR  . Penicillins     Per  [facility]    Patient Measurements:    Body Weight: 81.6 kg  Vital Signs: Temp: 99.1 F (37.3 C) (09/04 0800) Temp src: Axillary (09/04 0800) BP: 166/59 mmHg (09/04 1030) Pulse Rate: 64 (09/04 1030) Intake/Output from previous day:   Intake/Output from this shift:    Labs:  Recent Labs  09/10/13 0830  WBC 7.5  HGB 10.9*  PLT 129*  CREATININE 3.16*   The CrCl is unknown because both a height and weight (above a minimum accepted value) are required for this calculation. No results found for this basename: VANCOTROUGH, VANCOPEAK, VANCORANDOM, GENTTROUGH, GENTPEAK, GENTRANDOM, TOBRATROUGH, TOBRAPEAK, TOBRARND, AMIKACINPEAK, AMIKACINTROU, AMIKACIN,  in the last 72 hours   Microbiology: No results found for this or any previous visit (from the past 720 hour(s)).  Medical History: Past Medical History  Diagnosis Date  . CVA (cerebral infarction)   . Diastolic heart failure   . Anemia   . Atrial fibrillation   . Acute on chronic kidney disease, stage 3   . Hypertension   . Hx of BKA   . Kidney stones   . Pancreatitis   . Urinary tract infection     Medications:  See medication history Assessment: 80 yo lady to start broad spectrum antibiotics for PNA.  She has what sounds like dysphagia and food pocketing in the SNF. Vancomycin 1 gm ordered in ED.  Goal of Therapy:  Vancomycin trough level 15-20 mcg/ml  Plan:  Cont vancomycin 1gm IV q48 hours Primaxin 250 mg IV q12 hours F/u cultures, clinical course and renal function.  Talbert Cage [place]et 09/10/2013,12:07 PM

## 2013-09-10 NOTE — ED Notes (Signed)
IV team on the way to start PICC line while patient waits for bed

## 2013-09-10 NOTE — ED Notes (Signed)
CBG 95 

## 2013-09-10 NOTE — Progress Notes (Signed)
Peripherally Inserted Central Catheter/Midline Placement  The IV Nurse has discussed with the patient and/or persons authorized to consent for the patient, the purpose of this procedure and the potential benefits and risks involved with this procedure.  The benefits include less needle sticks, lab draws from the catheter and patient may be discharged home with the catheter.  Risks include, but not limited to, infection, bleeding, blood clot (thrombus formation), and puncture of an artery; nerve damage and irregular heat beat.  Alternatives to this procedure were also discussed.  PICC/Midline Placement Documentation  PICC / Midline Double Lumen 09/10/13 PICC Right Basilic 41 cm 3 cm (Active)  Indication for Insertion or Continuance of Line Poor Vasculature-patient has had multiple peripheral attempts or PIVs lasting less than 24 hours 09/10/2013  3:00 PM  Exposed Catheter (cm) 3 cm 09/10/2013  3:00 PM  Dressing Change Due 09/17/13 09/10/2013  3:00 PM       Stacie Glaze Horton 09/10/2013, 3:26 PM

## 2013-09-11 ENCOUNTER — Inpatient Hospital Stay (HOSPITAL_COMMUNITY): Payer: PRIVATE HEALTH INSURANCE

## 2013-09-11 LAB — CBC
HCT: 25 % — ABNORMAL LOW (ref 36.0–46.0)
HEMOGLOBIN: 8.2 g/dL — AB (ref 12.0–15.0)
MCH: 27.5 pg (ref 26.0–34.0)
MCHC: 32.8 g/dL (ref 30.0–36.0)
MCV: 83.9 fL (ref 78.0–100.0)
Platelets: 94 10*3/uL — ABNORMAL LOW (ref 150–400)
RBC: 2.98 MIL/uL — AB (ref 3.87–5.11)
RDW: 14.6 % (ref 11.5–15.5)
WBC: 8.9 10*3/uL (ref 4.0–10.5)

## 2013-09-11 LAB — MRSA PCR SCREENING: MRSA by PCR: POSITIVE — AB

## 2013-09-11 LAB — BASIC METABOLIC PANEL
Anion gap: 12 (ref 5–15)
BUN: 47 mg/dL — ABNORMAL HIGH (ref 6–23)
CHLORIDE: 109 meq/L (ref 96–112)
CO2: 23 mEq/L (ref 19–32)
Calcium: 8.4 mg/dL (ref 8.4–10.5)
Creatinine, Ser: 3.28 mg/dL — ABNORMAL HIGH (ref 0.50–1.10)
GFR calc Af Amer: 14 mL/min — ABNORMAL LOW (ref >90)
GFR, EST NON AFRICAN AMERICAN: 12 mL/min — AB (ref >90)
GLUCOSE: 91 mg/dL (ref 70–99)
Potassium: 4.6 mEq/L (ref 3.7–5.3)
SODIUM: 144 meq/L (ref 137–147)

## 2013-09-11 LAB — GLUCOSE, CAPILLARY
GLUCOSE-CAPILLARY: 126 mg/dL — AB (ref 70–99)
Glucose-Capillary: 97 mg/dL (ref 70–99)
Glucose-Capillary: 109 mg/dL — ABNORMAL HIGH (ref 70–99)
Glucose-Capillary: 122 mg/dL — ABNORMAL HIGH (ref 70–99)
Glucose-Capillary: 107 mg/dL — ABNORMAL HIGH (ref 70–99)

## 2013-09-11 LAB — URINE CULTURE
COLONY COUNT: NO GROWTH
Culture: NO GROWTH

## 2013-09-11 LAB — LEGIONELLA ANTIGEN, URINE: LEGIONELLA ANTIGEN, URINE: NEGATIVE

## 2013-09-11 LAB — PROCALCITONIN: Procalcitonin: 33.91 ng/mL

## 2013-09-11 MED ORDER — MUPIROCIN 2 % EX OINT
1.0000 "application " | TOPICAL_OINTMENT | Freq: Two times a day (BID) | CUTANEOUS | Status: DC
  Administered 2013-09-11 – 2013-09-14 (×7): 1 via NASAL
  Filled 2013-09-11: qty 22

## 2013-09-11 MED ORDER — WHITE PETROLATUM GEL
Status: AC
  Administered 2013-09-11: 0.2
  Filled 2013-09-11: qty 5

## 2013-09-11 MED ORDER — SODIUM CHLORIDE 0.9 % IV SOLN
INTRAVENOUS | Status: AC
  Administered 2013-09-11: 14:00:00 via INTRAVENOUS

## 2013-09-11 MED ORDER — CHLORHEXIDINE GLUCONATE CLOTH 2 % EX PADS
6.0000 | MEDICATED_PAD | Freq: Every day | CUTANEOUS | Status: AC
  Administered 2013-09-12 – 2013-09-13 (×2): 6 via TOPICAL

## 2013-09-11 MED ORDER — MORPHINE SULFATE 2 MG/ML IJ SOLN
2.0000 mg | Freq: Once | INTRAMUSCULAR | Status: AC
Start: 2013-09-12 — End: 2013-09-12
  Administered 2013-09-12: 2 mg via INTRAVENOUS
  Filled 2013-09-11: qty 1

## 2013-09-11 NOTE — Progress Notes (Signed)
SLP Cancellation Note  Patient Details Name: [name] MRN: [id number] DOB: 04-03-1927   Cancelled treatment:       Reason Eval/Treat Not Completed: Patient not medically ready. Pt on BiPAP. Will continue to follow to reassess readiness for bedside swallow.   Metro Kung, MA, CCC-SLP 09/11/2013, 1:51 PM

## 2013-09-11 NOTE — Progress Notes (Signed)
Subjective: Unable to come off bilevel this am due to hypoxia and increased wob.    Objective: Vital signs in last 24 hours: Blood pressure 165/59, pulse 77, temperature 100 F (37.8 C), temperature source Axillary, resp. rate 25, height  (1.6 m), weight 71 kg (156 lb 8.4 oz), SpO2 93.00%.  Intake/Output from previous day: 09/04 0701 - 09/05 0700 In: 75 [I.V.:75] Out: 100 [Urine:100]   Physical Exam:   wd female in mild respiratory distress on bilevel Unable to examine nose or OP due to bilevel Neck without LN or TMG Chest with decreased bs on left, crackles on right Cor with tachy abd benign LE with left AKA, mild edema on right Awake, seems appropriate, moves all 4.   Lab Results:  Recent Labs  09/10/13 0830 09/11/13 0448  WBC 7.5 8.9  HGB 10.9* 8.2*  HCT 33.3* 25.0*  PLT 129* 94*   BMET  Recent Labs  09/10/13 0830 09/11/13 0448  NA 141 144  K 4.9 4.6  CL 109 109  CO2 16* 23  GLUCOSE 113* 91  BUN 43* 47*  CREATININE 3.16* 3.28*  CALCIUM 9.1 8.4    Studies/Results: Dg Chest Port 1 View  09/11/2013   CLINICAL DATA:  Follow-up pneumonia  EXAM: PORTABLE CHEST - 1 VIEW  COMPARISON:  09/10/2013  FINDINGS: Complete opacification of the left hemithorax, unchanged. Abrupt cutoff of the left mainstem bronchus. While the rapid progression favors superimposed atelectasis/infection, an underlying central obstructing mass remains possible.  Multifocal patchy opacities in the right upper and lower lobes, suspicious for pneumonia. No pneumothorax.  Mild leftward cardiomediastinal shift.  IMPRESSION: Multifocal right lung pneumonia.  Complete opacification of the left hemithorax, unchanged. Central obstructing mass is not excluded.   Electronically Signed   By: Charline Bills M.D.   On: 09/11/2013 08:21   Dg Chest Port 1 View  09/10/2013   CLINICAL DATA:  Confirm line placement.  EXAM: PORTABLE CHEST - 1 VIEW  COMPARISON:  09/10/2013  FINDINGS: Right PICC line is in  place with the tip at the cavoatrial junction. Complete white out of the left hemithorax, worsened since prior study. Patchy airspace disease in the right lung, increasing since prior study. Very low lung volumes.  IMPRESSION: Right PICC line tip at the cavoatrial junction.  Complete opacification of the left hemithorax and increasing patchy airspace disease in the right lung.   Electronically Signed   By: Charlett Nose M.D.   On: 09/10/2013 15:51   Dg Chest Port 1 View  09/10/2013   CLINICAL DATA:  Respiratory distress.  EXAM: PORTABLE CHEST - 1 VIEW  COMPARISON:  June 26, 2010.  FINDINGS: Stable cardiomediastinal silhouette. Large left upper lobe airspace opacity is noted consistent with pneumonia. Mild right upper lobe opacity is noted concerning for pneumonia. Narrowing of the right subacromial space is noted suggesting rotator cuff injury. No pneumothorax or significant pleural effusion is noted.  IMPRESSION: New large left upper lobe pneumonia. Mild right upper lobe opacity is noted concerning for pneumonia.   Electronically Signed   By: Roque Lias M.D.   On: 09/10/2013 08:36    Assessment/Plan:  1) Acute respiratory failure due to aspiration pna vs HCAP. The pt has total opacification of left lung due to mucus plugging, and extensive ASDZ on right as well.  She has been unable to come off bipap, and is being treated with appropriate abx.  -continue support with bilevel.  Family ok with DNR, and will work on comfort measures  if she begins to worsen.  The prognosis is poor here. -continue abx, pulmonary hygiene.    Barbaraann Share, M.D. 09/11/2013, 12:39 PM

## 2013-09-11 NOTE — Progress Notes (Signed)
CRITICAL VALUE ALERT  Critical value received:  Positive BC  Date of notification:  09/11/2013  Time of notification:  1118  Critical value read back:Yes.    Nurse who received alert:  Burnard Hawthorne RN  MD notified (1st page):     Time of first page:  1119  MD notified (2nd page):  Time of second page:  Responding MD:  Susie Cassette  Time MD responded:  1120

## 2013-09-11 NOTE — Consult Note (Signed)
WOC wound consult note Reason for Consult: Sacral and right foot Pressure Ulcers. Patient was followed by the outpatient wound care center at [facility] for 8 months for the healing of the right foot pressure ulcers at the heel and 5th metatarsal head and sacral ulcers. Wound type:Pressure Pressure Ulcer POA: Yes Measurement: Sacral Pressure Ulcer, nearly healed, Full thickness (at least a Stage III): This area reopens due to dressings being taken off frequently secondary to oozing of stool. Evidence of healing surrounds the area of hypergranulation with scattered partial thickness tissue loss measuring 1.5cm x 3cm x 0.2cm.  I will suggest covering with a 1/8 inch thick layer of zinc oxide based barrier cream and replacing often to maintain cover.  Patient will continued to be turned side to side and we will avoid the supine position. The right foot, 5th metatarsal head presents with a 1cm round, skin level (no depth) ulcer that is not reepithelialized, but rather is covered with a thin layer of stable eschar.  Given the state of illness, I recommend only a saline dressing to this area, once daily to keep free of infection and to keep from further injury.  I will provide a Prevalon Boot which is superior to that provided by her nursing home, for this area as well as the healed Stage IV pressure Ulcer on the right heel with is currently covered by scar tissue and stable. WOC nursing team will not follow, but will remain available to this patient, the nursing and medical teams.  Please re-consult if needed. Thanks, Ladona Mow, MSN, RN, GNP, [place], CWON-AP [phone])

## 2013-09-11 NOTE — Progress Notes (Addendum)
[facility] PROGRESS NOTE  [name] ZOX:[id number] DOB: Oct 22, 1927 DOA: 09/10/2013 PCP: Eloisa Northern, MD  Assessment/Plan: Active Problems:   Acute respiratory failure   Acute renal failure   Acidosis    Acute respiratory failure in the setting of bilateral L>R pneumonia: aspiration vs HCAP  Patient admitted for recurrent pneumonia likely secondary to aspiration.  Goals of care established by PCCM-do not want intubation, okay with BiPAP Plan  Continue in SDU  BIPAP up to another 24 hours. If no improvement would begin to discuss comfort oriented care  Send urine antigens for strep and legionella  procalcitonin 33.9 We'll start the patient on ice chips Intermittently transition to nasal cannula to see if the patient can tolerate coming off BiPAP Continue treatment for HCAP Scheduled BDs  Needs SLP eval if improves.   H/o atrial fib -currently in sinus rhythm  H/o Diastolic dysfxn   H/o HTN  H/o severe PVD    SIRS/sepsis in setting of HCAP  Continue SDU  Lactic acid 1.2 IVFs  Tele shows PACs   Acute on chronic renal failure  Baseline 1.8 Renal dose meds  IVFs  F/u chemistry  Check UC and UA  +anion gap/NAP Metabolic acidosis: in setting of sepsis and renal failure  Plan  Admit to SDU  Bicarb gtt  Close obs chemistry   Acute encephalopathy superimposed on underlying dementia ? parkinsons dz  Plan  Hold all sedating meds  Supportive care  Cont symmetrel if able   Sacral decub ulcer  Plan  WOC consult   Dysphagia and protein cal malnutrition  Plan  NPO  SLP eval if MS improved  May need to consider tubefeeds   Iron def anemia and thrombocytopenia  Plan  Transfuse for hgb <7  Trend cbc  Add back feso4 when ms improved      Code Status: full Family Communication: family updated about patient's clinical progress Disposition Plan:  As above    Brief narrative: 80 year old female, resides at SNF. H/o af, severe PAD, PD w/  dementia, prob dysphagia and recent PNA. Admitted from SNF on 9/4 w/ working dx of acute resp failure in setting of HCAP vs aspiration  9/4 admitted to SDU for prob aspiration pneumonia. Goals of care discussion w/ daughter. Full DNR. BIPAP, IVFs, antibiotics. If no improvement w/ in 48 hrs would be open to comfort oriented care. Daghter's contacts: [name] [phone]  LINES / TUBES:  CULTURES:  BCX2 9/4>>>  UC 89/4>>>  u strep 9/4>>>  u legionella 9/4>>   ANTIBIOTICS:  primaxin 9/4>>>  Vanc 9/4>>>  Consultants:  Critical care  Procedures:  None  Antibiotics:  Primaxin  Vancomycin  HPI/Subjective: Patient requesting water  Objective: Filed Vitals:   09/11/13 0344 09/11/13 0529 09/11/13 0530 09/11/13 0802  BP: 144/43     Pulse: 65  70   Temp: 99.5 F (37.5 C)   98.4 F (36.9 C)  TempSrc: Axillary   Axillary  Resp: 17  21   Height:      Weight:      SpO2: 97% 93% 93% 90%    Intake/Output Summary (Last 24 hours) at 09/11/13 1012 Last data filed at 09/11/13 0800  Gross per 24 hour  Intake    150 ml  Output    350 ml  Net   -200 ml    Exam:  General: Chronically ill appearing white female.  Neuro: Arouses to voice. Can't move left. Right upper is weak, HEENT: BIPAP mask  in place  Cardiovascular: Reg irreg  Lungs: Scattered rhonchi  Abdomen: Soft, nontender  Musculoskeletal: left AKA.  Skin: Sacral decub        Data Reviewed: Basic Metabolic Panel:  Recent Labs Lab 09/10/13 0830 09/10/13 1640 09/11/13 0448  NA 141  --  144  K 4.9  --  4.6  CL 109  --  109  CO2 16*  --  23  GLUCOSE 113*  --  91  BUN 43*  --  47*  CREATININE 3.16*  --  3.28*  CALCIUM 9.1  --  8.4  MG  --  1.6  --   PHOS  --  3.7  --     Liver Function Tests: No results found for this basename: AST, ALT, ALKPHOS, BILITOT, PROT, ALBUMIN,  in the last 168 hours No results found for this basename: LIPASE, AMYLASE,  in the last 168 hours No results found for this  basename: AMMONIA,  in the last 168 hours  CBC:  Recent Labs Lab 09/10/13 0830 09/11/13 0448  WBC 7.5 8.9  NEUTROABS 6.6  --   HGB 10.9* 8.2*  HCT 33.3* 25.0*  MCV 82.4 83.9  PLT 129* 94*    Cardiac Enzymes:  Recent Labs Lab 09/10/13 0830  TROPONINI <0.30   BNP (last 3 results)  Recent Labs  09/10/13 0830  PROBNP 5121.0*     CBG:  Recent Labs Lab 09/10/13 1732 09/10/13 1949 09/10/13 2335 09/11/13 0342 09/11/13 0807  GLUCAP 118* 103* 97 97 109*    Recent Results (from the past 240 hour(s))  MRSA PCR SCREENING     Status: Abnormal   Collection Time    09/11/13  1:06 AM      Result Value Ref Range Status   MRSA by PCR POSITIVE (*) NEGATIVE Final   Comment:            The GeneXpert MRSA Assay (FDA     approved for NASAL specimens     only), is one component of a     comprehensive MRSA colonization     surveillance program. It is not     intended to diagnose MRSA     infection nor to guide or     monitor treatment for     MRSA infections.     RESULT CALLED TO, READ BACK BY AND VERIFIED WITHLuther Redo RN [id number] [phone] GREEN R     Studies: Dg Chest Port 1 View  09/11/2013   CLINICAL DATA:  Follow-up pneumonia  EXAM: PORTABLE CHEST - 1 VIEW  COMPARISON:  09/10/2013  FINDINGS: Complete opacification of the left hemithorax, unchanged. Abrupt cutoff of the left mainstem bronchus. While the rapid progression favors superimposed atelectasis/infection, an underlying central obstructing mass remains possible.  Multifocal patchy opacities in the right upper and lower lobes, suspicious for pneumonia. No pneumothorax.  Mild leftward cardiomediastinal shift.  IMPRESSION: Multifocal right lung pneumonia.  Complete opacification of the left hemithorax, unchanged. Central obstructing mass is not excluded.   Electronically Signed   By: Charline Bills M.D.   On: 09/11/2013 08:21   Dg Chest Port 1 View  09/10/2013   CLINICAL DATA:  Confirm line placement.  EXAM:  PORTABLE CHEST - 1 VIEW  COMPARISON:  09/10/2013  FINDINGS: Right PICC line is in place with the tip at the cavoatrial junction. Complete white out of the left hemithorax, worsened since prior study. Patchy airspace disease in the right lung, increasing since prior  study. Very low lung volumes.  IMPRESSION: Right PICC line tip at the cavoatrial junction.  Complete opacification of the left hemithorax and increasing patchy airspace disease in the right lung.   Electronically Signed   By: Charlett Nose M.D.   On: 09/10/2013 15:51   Dg Chest Port 1 View  09/10/2013   CLINICAL DATA:  Respiratory distress.  EXAM: PORTABLE CHEST - 1 VIEW  COMPARISON:  June 26, 2010.  FINDINGS: Stable cardiomediastinal silhouette. Large left upper lobe airspace opacity is noted consistent with pneumonia. Mild right upper lobe opacity is noted concerning for pneumonia. Narrowing of the right subacromial space is noted suggesting rotator cuff injury. No pneumothorax or significant pleural effusion is noted.  IMPRESSION: New large left upper lobe pneumonia. Mild right upper lobe opacity is noted concerning for pneumonia.   Electronically Signed   By: Roque Lias M.D.   On: 09/10/2013 08:36    Scheduled Meds: . albuterol  2.5 mg Nebulization Q4H  . amantadine  100 mg Oral Daily  . Chlorhexidine Gluconate Cloth  6 each Topical Q0600  . heparin  5,000 Units Subcutaneous 3 times per day  . imipenem-cilastatin  250 mg Intravenous Q12H  . insulin aspart  0-9 Units Subcutaneous 6 times per day  . mupirocin ointment  1 application Nasal BID  . pantoprazole (PROTONIX) IV  40 mg Intravenous QHS  . sodium chloride  10-40 mL Intracatheter Q12H  . [START ON 09/12/2013] vancomycin  1,000 mg Intravenous Q48H   Continuous Infusions: .  sodium bicarbonate  infusion 1000 mL 75 mL/hr at 09/11/13 8295    Active Problems:   Acute respiratory failure   Acute renal failure   Acidosis    Time spent: 40 minutes   [facility]  [facility] Pager [phone]. If 7PM-7AM, please contact night-coverage at [email or web address], password [facility] 09/11/2013, 10:12 AM  LOS: 1 day

## 2013-09-12 LAB — COMPREHENSIVE METABOLIC PANEL
ALT: 6 U/L (ref 0–35)
AST: 10 U/L (ref 0–37)
Albumin: 2.3 g/dL — ABNORMAL LOW (ref 3.5–5.2)
Alkaline Phosphatase: 68 U/L (ref 39–117)
Anion gap: 11 (ref 5–15)
BUN: 46 mg/dL — ABNORMAL HIGH (ref 6–23)
CALCIUM: 8.7 mg/dL (ref 8.4–10.5)
CO2: 26 meq/L (ref 19–32)
Chloride: 108 mEq/L (ref 96–112)
Creatinine, Ser: 2.91 mg/dL — ABNORMAL HIGH (ref 0.50–1.10)
GFR calc Af Amer: 16 mL/min — ABNORMAL LOW (ref >90)
GFR calc non Af Amer: 14 mL/min — ABNORMAL LOW (ref >90)
Glucose, Bld: 116 mg/dL — ABNORMAL HIGH (ref 70–99)
Potassium: 3.8 mEq/L (ref 3.7–5.3)
SODIUM: 145 meq/L (ref 137–147)
Total Bilirubin: 0.7 mg/dL (ref 0.3–1.2)
Total Protein: 5.6 g/dL — ABNORMAL LOW (ref 6.0–8.3)

## 2013-09-12 LAB — GLUCOSE, CAPILLARY
GLUCOSE-CAPILLARY: 103 mg/dL — AB (ref 70–99)
GLUCOSE-CAPILLARY: 114 mg/dL — AB (ref 70–99)
GLUCOSE-CAPILLARY: 125 mg/dL — AB (ref 70–99)
GLUCOSE-CAPILLARY: 131 mg/dL — AB (ref 70–99)
GLUCOSE-CAPILLARY: 140 mg/dL — AB (ref 70–99)
Glucose-Capillary: 160 mg/dL — ABNORMAL HIGH (ref 70–99)

## 2013-09-12 LAB — PROCALCITONIN: Procalcitonin: 31.24 ng/mL

## 2013-09-12 LAB — CBC
HCT: 24.8 % — ABNORMAL LOW (ref 36.0–46.0)
HEMOGLOBIN: 8 g/dL — AB (ref 12.0–15.0)
MCH: 26.8 pg (ref 26.0–34.0)
MCHC: 32.3 g/dL (ref 30.0–36.0)
MCV: 83.2 fL (ref 78.0–100.0)
Platelets: 93 10*3/uL — ABNORMAL LOW (ref 150–400)
RBC: 2.98 MIL/uL — AB (ref 3.87–5.11)
RDW: 14.5 % (ref 11.5–15.5)
WBC: 7.3 10*3/uL (ref 4.0–10.5)

## 2013-09-12 MED ORDER — LORAZEPAM 2 MG/ML IJ SOLN: 0.5000 mg | INTRAMUSCULAR | Status: DC | PRN

## 2013-09-12 MED ORDER — SODIUM CHLORIDE 0.9 % IV SOLN: INTRAVENOUS | Status: DC

## 2013-09-12 MED ORDER — HYDRALAZINE HCL 20 MG/ML IJ SOLN
5.0000 mg | Freq: Once | INTRAMUSCULAR | Status: AC
  Administered 2013-09-12: 5 mg via INTRAVENOUS
  Filled 2013-09-12: qty 1

## 2013-09-12 MED ORDER — LORAZEPAM 2 MG/ML IJ SOLN
0.5000 mg | Freq: Once | INTRAMUSCULAR | Status: AC
  Administered 2013-09-12: 0.5 mg via INTRAVENOUS

## 2013-09-12 MED ORDER — ONDANSETRON HCL 4 MG/2ML IJ SOLN
INTRAMUSCULAR | Status: AC
  Administered 2013-09-12: 4 mg
  Filled 2013-09-12: qty 2

## 2013-09-12 MED ORDER — SODIUM BICARBONATE 8.4 % IV SOLN
INTRAVENOUS | Status: DC
  Administered 2013-09-12: 18:00:00 via INTRAVENOUS
  Filled 2013-09-12 (×3): qty 150

## 2013-09-12 MED ORDER — ONDANSETRON HCL 4 MG/2ML IJ SOLN
4.0000 mg | Freq: Four times a day (QID) | INTRAMUSCULAR | Status: DC | PRN
  Administered 2013-09-12 – 2013-09-14 (×3): 4 mg via INTRAVENOUS
  Filled 2013-09-12 (×4): qty 2

## 2013-09-12 MED ORDER — LORAZEPAM 2 MG/ML IJ SOLN
INTRAMUSCULAR | Status: AC
  Filled 2013-09-12: qty 1

## 2013-09-12 MED ORDER — LORAZEPAM 2 MG/ML IJ SOLN
0.5000 mg | INTRAMUSCULAR | Status: DC | PRN
  Filled 2013-09-12: qty 1

## 2013-09-12 MED ORDER — CHLORHEXIDINE GLUCONATE 0.12 % MT SOLN
15.0000 mL | Freq: Two times a day (BID) | OROMUCOSAL | Status: DC
  Administered 2013-09-12 – 2013-09-16 (×4): 15 mL via OROMUCOSAL
  Filled 2013-09-12 (×8): qty 15

## 2013-09-12 MED ORDER — CETYLPYRIDINIUM CHLORIDE 0.05 % MT LIQD
7.0000 mL | Freq: Two times a day (BID) | OROMUCOSAL | Status: DC
  Administered 2013-09-12 – 2013-09-16 (×7): 7 mL via OROMUCOSAL

## 2013-09-12 NOTE — Progress Notes (Signed)
Subjective: Unable to come off bilevel this am due to hypoxia but currently without increased wob.    Objective: Vital signs in last 24 hours: Blood pressure 156/112, pulse 93, temperature 98.8 F (37.1 C), temperature source Axillary, resp. rate 25, height  (1.6 m), weight 74.1 kg (163 lb 5.8 oz), SpO2 93.00%.  Intake/Output from previous day: 09/05 0701 - 09/06 0700 In: 2066.3 [I.V.:1866.3; IV Piggyback:200] Out: 1500 [Urine:1500]   Physical Exam:   wd female in mild respiratory distress on bilevel Unable to examine nose or OP due to bilevel Neck without LN or TMG Chest with decreased bs on left, crackles on right Cor with tachy but regular abd benign LE with left AKA, mild edema on right Awake, seems appropriate, moves all 4.   Lab Results:  Recent Labs  09/10/13 0830 09/11/13 0448 09/12/13 0540  WBC 7.5 8.9 7.3  HGB 10.9* 8.2* 8.0*  HCT 33.3* 25.0* 24.8*  PLT 129* 94* 93*   BMET  Recent Labs  09/10/13 0830 09/11/13 0448 09/12/13 0540  NA 141 144 145  K 4.9 4.6 3.8  CL 109 109 108  CO2 16* 23 26  GLUCOSE 113* 91 116*  BUN 43* 47* 46*  CREATININE 3.16* 3.28* 2.91*  CALCIUM 9.1 8.4 8.7    Studies/Results: Dg Chest Port 1 View  09/11/2013   CLINICAL DATA:  Follow-up pneumonia  EXAM: PORTABLE CHEST - 1 VIEW  COMPARISON:  09/10/2013  FINDINGS: Complete opacification of the left hemithorax, unchanged. Abrupt cutoff of the left mainstem bronchus. While the rapid progression favors superimposed atelectasis/infection, an underlying central obstructing mass remains possible.  Multifocal patchy opacities in the right upper and lower lobes, suspicious for pneumonia. No pneumothorax.  Mild leftward cardiomediastinal shift.  IMPRESSION: Multifocal right lung pneumonia.  Complete opacification of the left hemithorax, unchanged. Central obstructing mass is not excluded.   Electronically Signed   By: Charline Bills M.D.   On: 09/11/2013 08:21   Dg Chest Port 1  View  09/10/2013   CLINICAL DATA:  Confirm line placement.  EXAM: PORTABLE CHEST - 1 VIEW  COMPARISON:  09/10/2013  FINDINGS: Right PICC line is in place with the tip at the cavoatrial junction. Complete white out of the left hemithorax, worsened since prior study. Patchy airspace disease in the right lung, increasing since prior study. Very low lung volumes.  IMPRESSION: Right PICC line tip at the cavoatrial junction.  Complete opacification of the left hemithorax and increasing patchy airspace disease in the right lung.   Electronically Signed   By: Charlett Nose M.D.   On: 09/10/2013 15:51    Assessment/Plan:  1) Acute respiratory failure due to aspiration pna vs HCAP. The pt has total opacification of left lung due to mucus plugging, and extensive ASDZ on right as well.  She has been unable to come off bipap, and is being treated with appropriate abx.  I wonder if she would be more comfortable with the high flow oxygen system, provided her wob does not increase.  At some point if she continues to not improve, may have to talk with family again about move toward more comfort measures.  I do not think she is to that point unless the pt or her family feel otherwise.  -would like to try high flow oxygen system.  Family ok with DNR, and will work on comfort measures if she begins to worsen.  The prognosis is poor here overall unless she makes some type of turn-around soon. -continue  abx, pulmonary hygiene.    Will check again on Tues.  Please call if pulmonary issues arise in the interim.   Barbaraann Share, M.D. 09/12/2013, 12:40 PM

## 2013-09-12 NOTE — Progress Notes (Signed)
Upon entering room patients 2 daughters at her bedside unlatching bipap mask attempting to readjust themselves. Mask was readjusted by myself and educated daughters and other family in the room on the dangers of attempting to remove and adjust mask without appropriate staff at the bedside. Family nodded and stated they understood.

## 2013-09-12 NOTE — Progress Notes (Signed)
Attempted to take patient off BiPAP and placed on 55%VM.  Patient began to desat to low 80s and RR increased to high 30s.  Was placed back on BiPAP.  RT will continue to monitor.

## 2013-09-12 NOTE — Progress Notes (Addendum)
[facility] PROGRESS NOTE  [name] ZOX:[id number] DOB: 1927/10/22 DOA: 09/10/2013 PCP: Eloisa Northern, MD  Assessment/Plan: Active Problems:   Acute respiratory failure   Acute renal failure   Acidosis   Acute respiratory failure in the setting of bilateral L>R pneumonia: aspiration vs HCAP  Patient admitted for recurrent pneumonia likely secondary to aspiration. pt has total opacification of left lung due to mucus plugging, Goals of care established by PCCM-do not want intubation, okay with BiPAP  Blood cultures positive for gram-positive cocci in clusters Continue in SDU , broad-spectrum antibiotics BIPAP until discontinued by PCCM. If no improvement would begin to discuss comfort oriented care  Send urine antigens for strep and legionella  procalcitonin protocol We'll start the patient on ice chips for comfort  Intermittently transition to nasal cannula to see if the patient can tolerate coming off BiPAP  Continue treatment for HCAP  Scheduled BDs  Needs SLP eval if improves.  Continue with BiPAP for another 24 hours, reconsider comfort care measures if no improvement (this is what the family has decided)   H/o atrial fib -currently in sinus rhythm   H/o Diastolic dysfxn  H/o HTN  H/o severe PVD    SIRS/sepsis in setting of HCAP  Continue SDU  Lactic acid 1.2  IVFs  Tele shows PACs    Acute on chronic renal failure  Baseline 1.8  Slight improvement today Continue IVFs , sodium bicarbonate drip F/u chemistry  Check UC and UA  +anion gap/NAP Metabolic acidosis: in setting of sepsis and renal failure  Plan  Admit to SDU  Bicarb gtt  Follow labs  Acute encephalopathy superimposed on underlying dementia ? parkinsons dz  Plan  Hold all sedating meds  Supportive care  Cont symmetrel if able    Sacral decub ulcer  Plan  WOC consult    Dysphagia and protein cal malnutrition  Plan  NPO  SLP eval if MS improved and off BiPAP May need to  consider tubefeeds    Iron def anemia and thrombocytopenia  Plan  Transfuse for hgb <7  Trend cbc  Add back feso4 when ms improved    Code Status: full  Family Communication: family updated about patient's clinical progress  Disposition Plan: Transition to comfort care when recommended by critical care Discussed Beverly Gust Daughter [phone]   Brief narrative:  80 year old female, resides at [facility]. H/o af, severe PAD, PD w/ dementia, prob dysphagia and recent PNA. Admitted from SNF on 9/4 w/ working dx of acute resp failure in setting of HCAP vs aspiration  9/4 admitted to SDU for prob aspiration pneumonia. Goals of care discussion w/ daughter. Full DNR. BIPAP, IVFs, antibiotics. If no improvement w/ in 48 hrs would be open to comfort oriented care. Daghter's contacts: [name] [phone]  LINES / TUBES:  CULTURES:  BCX2 9/4>>>  UC 89/4>>>  u strep 9/4>>>  u legionella 9/4>>  ANTIBIOTICS:  primaxin 9/4>>>  Vanc 9/4>>>  Consultants:  Critical care Procedures:  None Antibiotics:  Primaxin  Vancomycin  HPI/Subjective: Patient still on the BiPAP, alert, does not endorse any shortness of breath  Objective: Filed Vitals:   09/12/13 0700 09/12/13 0800 09/12/13 0836 09/12/13 0837  BP:    161/55  Pulse:  90  82  Temp: 98.8 F (37.1 C)     TempSrc: Axillary     Resp:  20  23  Height:      Weight:      SpO2:  90% 95% 95%  Intake/Output Summary (Last 24 hours) at 09/12/13 1028 Last data filed at 09/12/13 1016  Gross per 24 hour  Intake 1811.25 ml  Output   1250 ml  Net 561.25 ml    Exam:  General: alert & oriented x 3 In NAD  Cardiovascular: RRR, nl S1 s2  Respiratory: Decreased breath sounds at the bases, scattered rhonchi, no crackles  Abdomen: soft +BS NT/ND, no masses palpable  Extremities:LE with left AKA, mild edema on right  Awake, seems appropriate, moves all        Data Reviewed: Basic Metabolic Panel:  Recent Labs Lab  09/10/13 0830 09/10/13 1640 09/11/13 0448 09/12/13 0540  NA 141  --  144 145  K 4.9  --  4.6 3.8  CL 109  --  109 108  CO2 16*  --  23 26  GLUCOSE 113*  --  91 116*  BUN 43*  --  47* 46*  CREATININE 3.16*  --  3.28* 2.91*  CALCIUM 9.1  --  8.4 8.7  MG  --  1.6  --   --   PHOS  --  3.7  --   --     Liver Function Tests:  Recent Labs Lab 09/12/13 0540  AST 10  ALT 6  ALKPHOS 68  BILITOT 0.7  PROT 5.6*  ALBUMIN 2.3*   No results found for this basename: LIPASE, AMYLASE,  in the last 168 hours No results found for this basename: AMMONIA,  in the last 168 hours  CBC:  Recent Labs Lab 09/10/13 0830 09/11/13 0448 09/12/13 0540  WBC 7.5 8.9 7.3  NEUTROABS 6.6  --   --   HGB 10.9* 8.2* 8.0*  HCT 33.3* 25.0* 24.8*  MCV 82.4 83.9 83.2  PLT 129* 94* 93*    Cardiac Enzymes:  Recent Labs Lab 09/10/13 0830  TROPONINI <0.30   BNP (last 3 results)  Recent Labs  09/10/13 0830  PROBNP 5121.0*     CBG:  Recent Labs Lab 09/11/13 1542 09/11/13 2014 09/11/13 2331 09/12/13 0404 09/12/13 0755  GLUCAP 126* 107* 103* 114* 125*    Recent Results (from the past 240 hour(s))  CULTURE, BLOOD (ROUTINE X 2)     Status: None   Collection Time    09/10/13 10:01 AM      Result Value Ref Range Status   Specimen Description BLOOD RIGHT FINGER   Final   Special Requests BOTTLES DRAWN AEROBIC AND ANAEROBIC 5CC   Final   Culture  Setup Time     Final   Value: 09/10/2013 14:28     Performed at [facility]   Culture     Final   Value: GRAM POSITIVE COCCI IN CLUSTERS     Note: Gram Stain Report Called to,Read Back By and Verified With: MELISSA HAWDESHELL 09/11/13 @ 11:17AM BY RUSCOE     Performed at [facility]   Report Status PENDING   Incomplete  CULTURE, BLOOD (ROUTINE X 2)     Status: None   Collection Time    09/10/13 10:10 AM      Result Value Ref Range Status   Specimen Description BLOOD RIGHT THUMB   Final   Special Requests BOTTLES DRAWN  AEROBIC AND ANAEROBIC 5CC   Final   Culture  Setup Time     Final   Value: 09/10/2013 14:28     Performed at [facility]   Culture     Final   Value: GRAM POSITIVE COCCI  IN CLUSTERS     Note: Gram Stain Report Called to,Read Back By and Verified With: MELISSA HAWDESHELL 09/11/13 @ 11:17AM BY RUSCOE A.     Performed at [facility]   Report Status PENDING   Incomplete  URINE CULTURE     Status: None   Collection Time    09/10/13 12:03 PM      Result Value Ref Range Status   Specimen Description URINE, CATHETERIZED   Final   Special Requests NONE   Final   Culture  Setup Time     Final   Value: 09/10/2013 18:01     Performed at [facility] Count     Final   Value: NO GROWTH     Performed at [facility]   Culture     Final   Value: NO GROWTH     Performed at [facility]   Report Status 09/11/2013 FINAL   Final  MRSA PCR SCREENING     Status: Abnormal   Collection Time    09/11/13  1:06 AM      Result Value Ref Range Status   MRSA by PCR POSITIVE (*) NEGATIVE Final   Comment:            The GeneXpert MRSA Assay (FDA     approved for NASAL specimens     only), is one component of a     comprehensive MRSA colonization     surveillance program. It is not     intended to diagnose MRSA     infection nor to guide or     monitor treatment for     MRSA infections.     RESULT CALLED TO, READ BACK BY AND VERIFIED WITHLuther Redo RN [id number] [phone] GREEN R     Studies: Dg Chest Port 1 View  09/11/2013   CLINICAL DATA:  Follow-up pneumonia  EXAM: PORTABLE CHEST - 1 VIEW  COMPARISON:  09/10/2013  FINDINGS: Complete opacification of the left hemithorax, unchanged. Abrupt cutoff of the left mainstem bronchus. While the rapid progression favors superimposed atelectasis/infection, an underlying central obstructing mass remains possible.  Multifocal patchy opacities in the right upper and lower lobes, suspicious for pneumonia. No pneumothorax.   Mild leftward cardiomediastinal shift.  IMPRESSION: Multifocal right lung pneumonia.  Complete opacification of the left hemithorax, unchanged. Central obstructing mass is not excluded.   Electronically Signed   By: Charline Bills M.D.   On: 09/11/2013 08:21   Dg Chest Port 1 View  09/10/2013   CLINICAL DATA:  Confirm line placement.  EXAM: PORTABLE CHEST - 1 VIEW  COMPARISON:  09/10/2013  FINDINGS: Right PICC line is in place with the tip at the cavoatrial junction. Complete white out of the left hemithorax, worsened since prior study. Patchy airspace disease in the right lung, increasing since prior study. Very low lung volumes.  IMPRESSION: Right PICC line tip at the cavoatrial junction.  Complete opacification of the left hemithorax and increasing patchy airspace disease in the right lung.   Electronically Signed   By: Charlett Nose M.D.   On: 09/10/2013 15:51   Dg Chest Port 1 View  09/10/2013   CLINICAL DATA:  Respiratory distress.  EXAM: PORTABLE CHEST - 1 VIEW  COMPARISON:  June 26, 2010.  FINDINGS: Stable cardiomediastinal silhouette. Large left upper lobe airspace opacity is noted consistent with pneumonia. Mild right upper lobe opacity is noted concerning for pneumonia. Narrowing of  the right subacromial space is noted suggesting rotator cuff injury. No pneumothorax or significant pleural effusion is noted.  IMPRESSION: New large left upper lobe pneumonia. Mild right upper lobe opacity is noted concerning for pneumonia.   Electronically Signed   By: Roque Lias M.D.   On: 09/10/2013 08:36    Scheduled Meds: . albuterol  2.5 mg Nebulization Q4H  . amantadine  100 mg Oral Daily  . Chlorhexidine Gluconate Cloth  6 each Topical Q0600  . heparin  5,000 Units Subcutaneous 3 times per day  . imipenem-cilastatin  250 mg Intravenous Q12H  . insulin aspart  0-9 Units Subcutaneous 6 times per day  . mupirocin ointment  1 application Nasal BID  . pantoprazole (PROTONIX) IV  40 mg Intravenous QHS  .  sodium chloride  10-40 mL Intracatheter Q12H  . vancomycin  1,000 mg Intravenous Q48H   Continuous Infusions: .  sodium bicarbonate  infusion 1000 mL Stopped (09/11/13 1407)    Active Problems:   Acute respiratory failure   Acute renal failure   Acidosis    Time spent: 40 minutes   [facility]  [facility] Pager [phone]. If 7PM-7AM, please contact night-coverage at [email or web address], password [facility] 09/12/2013, 10:28 AM  LOS: 2 days

## 2013-09-12 NOTE — Progress Notes (Signed)
Utilization review completed.  

## 2013-09-12 NOTE — Progress Notes (Signed)
SLP Cancellation Note  Patient Details Name: [name] MRN: [id number] DOB: 03-Jul-1927   Cancelled treatment:        Chart review reveals pt continues to require BiPAP. Will continue efforts as pt status improves.  Celia B. Murvin Natal [facility], CCC-SLP [phone]  [name] 09/12/2013, 1:58 PM

## 2013-09-12 NOTE — ED Provider Notes (Signed)
CSN: [id number]     Arrival date & time 09/10/13  0754 History   First MD Initiated Contact with Patient 09/10/13 [phone]     Chief Complaint  Patient presents with  . Respiratory Distress     (Consider location/radiation/quality/duration/timing/severity/associated sxs/prior Treatment) HPI Comments: 80 year old female w/ h/o diastolic dysfxn, prior CVA requiring assist w/ ADLs since 2012, resides at [facility]. Presented to ER with cc of dib. LEVEL 5 caveat for respiratory distress. Per EMS, pt was in respiratory failure, with hypoxia, and so she was started on bipap and was given breathing tx en route. Pt has no known fevers. She denies chest pain.  The history is provided by the patient.    Past Medical History  Diagnosis Date  . CVA (cerebral infarction)   . Diastolic heart failure   . Anemia   . Atrial fibrillation   . Acute on chronic kidney disease, stage 3   . Hypertension   . Hx of BKA   . Kidney stones   . Pancreatitis   . Urinary tract infection    Past Surgical History  Procedure Laterality Date  . Below knee leg amputation     No family history on file. History  Substance Use Topics  . Smoking status: Never Smoker   . Smokeless tobacco: Never Used  . Alcohol Use: No   OB History   Grav Para Term Preterm Abortions TAB SAB Ect Mult Living                 Review of Systems  Unable to perform ROS: Severe respiratory distress      Allergies  Iodinated diagnostic agents and Penicillins  Home Medications   Prior to Admission medications   Medication Sig Start Date End Date Taking? Authorizing Provider  amantadine (SYMMETREL) 100 MG capsule Take 100 mg by mouth daily.   Yes Historical Provider, MD  amLODipine (NORVASC) 10 MG tablet Take 10 mg by mouth daily.     Yes Historical Provider, MD  bisacodyl (DULCOLAX) 10 MG suppository Place 10 mg rectally daily as needed. constipation    Yes Historical Provider, MD  camphor-menthol (SARNA) lotion Apply 1 application  topically 4 (four) times daily as needed for itching.    Yes Historical Provider, MD  carvedilol (COREG) 12.5 MG tablet Take 12.5 mg by mouth 2 (two) times daily with a meal.   Yes Historical Provider, MD  cloNIDine (CATAPRES) 0.1 MG tablet Take 0.1 mg by mouth daily as needed (for SBP greater than 180).   Yes Historical Provider, MD  clopidogrel (PLAVIX) 75 MG tablet Take 75 mg by mouth daily.     Yes Historical Provider, MD  Cranberry 425 MG CAPS Take 1 capsule by mouth daily.   Yes Historical Provider, MD  diphenhydrAMINE (BENADRYL) 12.5 MG/5ML liquid Take 12.5 mg by mouth 2 (two) times daily as needed. allergies    Yes Historical Provider, MD  ferrous sulfate 325 (65 FE) MG tablet Take 325 mg by mouth daily with breakfast.     Yes Historical Provider, MD  furosemide (LASIX) 20 MG tablet Take 20 mg by mouth 3 (three) times a week. On Mon, Wed, Fri   Yes Historical Provider, MD  gabapentin (NEURONTIN) 300 MG capsule Take 300 mg by mouth 2 (two) times daily.    Yes Historical Provider, MD  HYDROcodone-acetaminophen (NORCO) 5-325 MG per tablet Take 1 tablet by mouth every 4 (four) hours as needed. Mild to severe pain   Yes Historical Provider,  MD  ipratropium-albuterol (DUONEB) 0.5-2.5 (3) MG/3ML SOLN Take 3 mLs by nebulization every 6 (six) hours as needed (for congestion).   Yes Historical Provider, MD  levalbuterol [facility] HFA) 45 MCG/ACT inhaler Inhale 2 puffs into the lungs every 6 (six) hours as needed. dyspnea    Yes Historical Provider, MD  loratadine (CLARITIN) 10 MG tablet Take 10 mg by mouth daily as needed for itching.   Yes Historical Provider, MD  LORazepam (ATIVAN) 0.5 MG tablet Take 0.5 mg by mouth at bedtime. For agitation   Yes Historical Provider, MD  LORazepam (ATIVAN) 0.5 MG tablet Take 0.5 mg by mouth 2 (two) times daily as needed. For agitation   Yes Historical Provider, MD  Multiple Vitamins-Minerals (MULTIVITAMINS THER. W/MINERALS) TABS Take 1 tablet by mouth daily.     Yes  Historical Provider, MD  nitroGLYCERIN (NITROSTAT) 0.4 MG SL tablet Place 0.4 mg under the tongue every 5 (five) minutes as needed for chest pain.   Yes Historical Provider, MD  Olopatadine HCl (PATADAY) 0.2 % SOLN Place 1 drop into both eyes daily.   Yes Historical Provider, MD  ondansetron (ZOFRAN) 4 MG tablet Take 1 tablet (4 mg total) by mouth every 6 (six) hours as needed for nausea. 09/24/11  Yes Gavin Pound. [name], MD  pantoprazole (PROTONIX) 40 MG tablet Take 40 mg by mouth daily.   Yes Historical Provider, MD  polyvinyl alcohol (LIQUIFILM TEARS) 1.4 % ophthalmic solution Place 1 drop into both eyes 4 (four) times daily as needed for dry eyes.    Yes Historical Provider, MD  potassium chloride SA (K-DUR,KLOR-CON) 20 MEQ tablet Take 20 mEq by mouth 2 (two) times daily.   Yes Historical Provider, MD  risperiDONE (RISPERDAL) 1 MG tablet Take 1 mg by mouth 2 (two) times daily.   Yes Historical Provider, MD  senna-docusate (SENOKOT-S) 8.6-50 MG per tablet Take 1 tablet by mouth 2 (two) times daily as needed for moderate constipation.    Yes Historical Provider, MD  sertraline (ZOLOFT) 25 MG tablet Take 25 mg by mouth daily.   Yes Historical Provider, MD  sodium chloride (OCEAN) 0.65 % SOLN nasal spray Place 1 spray into both nostrils every 8 (eight) hours as needed (for dry nose).   Yes Historical Provider, MD  traMADol (ULTRAM) 50 MG tablet Take 50 mg by mouth every 8 (eight) hours as needed. Pain. Maximum dose= 8 tablets per day    Yes Historical Provider, MD  vitamin C (ASCORBIC ACID) 500 MG tablet Take 500 mg by mouth 2 (two) times daily.   Yes Historical Provider, MD  Vitamin D, Ergocalciferol, (DRISDOL) 50000 UNITS CAPS Take 50,000 Units by mouth daily. For 30 days starting on 08/21/13   Yes Historical Provider, MD   BP 161/55  Pulse 82  Temp(Src) 98.8 F (37.1 C) (Axillary)  Resp 23  Ht  (1.6 m)  Wt 163 lb 5.8 oz (74.1 kg)  BMI 28.95 kg/m2  SpO2 95% Physical Exam  Nursing note and  vitals reviewed. Constitutional: She appears well-developed.  HENT:  Head: Atraumatic.  Eyes: Conjunctivae are normal.  Neck: Neck supple.  Cardiovascular: Normal rate.   Pulmonary/Chest: She is in respiratory distress. She has rales.  Abdominal: She exhibits no distension. There is no tenderness.  Neurological: She is alert.    ED Course  Procedures (including critical care time) Labs Review Labs Reviewed  MRSA PCR SCREENING - Abnormal; Notable for the following:    MRSA by PCR POSITIVE (*)  All other components within normal limits  CBC WITH DIFFERENTIAL - Abnormal; Notable for the following:    Hemoglobin 10.9 (*)    HCT 33.3 (*)    Platelets 129 (*)    Neutrophils Relative % 89 (*)    Lymphocytes Relative 7 (*)    Lymphs Abs 0.5 (*)    All other components within normal limits  BASIC METABOLIC PANEL - Abnormal; Notable for the following:    CO2 16 (*)    Glucose, Bld 113 (*)    BUN 43 (*)    Creatinine, Ser 3.16 (*)    GFR calc non Af Amer 13 (*)    GFR calc Af Amer 15 (*)    Anion gap 16 (*)    All other components within normal limits  PRO B NATRIURETIC PEPTIDE - Abnormal; Notable for the following:    Pro B Natriuretic peptide (BNP) 5121.0 (*)    All other components within normal limits  URINALYSIS, ROUTINE W REFLEX MICROSCOPIC - Abnormal; Notable for the following:    APPearance HAZY (*)    Protein, ur >300 (*)    All other components within normal limits  APTT - Abnormal; Notable for the following:    aPTT 23 (*)    All other components within normal limits  URINE MICROSCOPIC-ADD ON - Abnormal; Notable for the following:    Squamous Epithelial / LPF FEW (*)    Bacteria, UA FEW (*)    Casts HYALINE CASTS (*)    All other components within normal limits  CBC - Abnormal; Notable for the following:    RBC 2.98 (*)    Hemoglobin 8.2 (*)    HCT 25.0 (*)    Platelets 94 (*)    All other components within normal limits  BASIC METABOLIC PANEL - Abnormal;  Notable for the following:    BUN 47 (*)    Creatinine, Ser 3.28 (*)    GFR calc non Af Amer 12 (*)    GFR calc Af Amer 14 (*)    All other components within normal limits  GLUCOSE, CAPILLARY - Abnormal; Notable for the following:    Glucose-Capillary 118 (*)    All other components within normal limits  GLUCOSE, CAPILLARY - Abnormal; Notable for the following:    Glucose-Capillary 103 (*)    All other components within normal limits  GLUCOSE, CAPILLARY - Abnormal; Notable for the following:    Glucose-Capillary 109 (*)    All other components within normal limits  GLUCOSE, CAPILLARY - Abnormal; Notable for the following:    Glucose-Capillary 122 (*)    All other components within normal limits  GLUCOSE, CAPILLARY - Abnormal; Notable for the following:    Glucose-Capillary 126 (*)    All other components within normal limits  COMPREHENSIVE METABOLIC PANEL - Abnormal; Notable for the following:    Glucose, Bld 116 (*)    BUN 46 (*)    Creatinine, Ser 2.91 (*)    Total Protein 5.6 (*)    Albumin 2.3 (*)    GFR calc non Af Amer 14 (*)    GFR calc Af Amer 16 (*)    All other components within normal limits  CBC - Abnormal; Notable for the following:    RBC 2.98 (*)    Hemoglobin 8.0 (*)    HCT 24.8 (*)    Platelets 93 (*)    All other components within normal limits  GLUCOSE, CAPILLARY - Abnormal; Notable for the following:  Glucose-Capillary 107 (*)    All other components within normal limits  GLUCOSE, CAPILLARY - Abnormal; Notable for the following:    Glucose-Capillary 103 (*)    All other components within normal limits  GLUCOSE, CAPILLARY - Abnormal; Notable for the following:    Glucose-Capillary 114 (*)    All other components within normal limits  GLUCOSE, CAPILLARY - Abnormal; Notable for the following:    Glucose-Capillary 125 (*)    All other components within normal limits  I-STAT ARTERIAL BLOOD GAS, ED - Abnormal; Notable for the following:    pH, Arterial  7.292 (*)    pO2, Arterial 67.0 (*)    Bicarbonate 18.2 (*)    Acid-base deficit 8.0 (*)    All other components within normal limits  CULTURE, BLOOD (ROUTINE X 2)  CULTURE, BLOOD (ROUTINE X 2)  URINE CULTURE  TROPONIN I  PROTIME-INR  MAGNESIUM  PHOSPHORUS  LACTIC ACID, PLASMA  STREP PNEUMONIAE URINARY ANTIGEN  LEGIONELLA ANTIGEN, URINE  PROCALCITONIN  PROCALCITONIN  GLUCOSE, CAPILLARY  GLUCOSE, CAPILLARY  PROCALCITONIN  I-STAT CG4 LACTIC ACID, ED  CBG MONITORING, ED    Imaging Review Dg Chest Port 1 View  09/11/2013   CLINICAL DATA:  Follow-up pneumonia  EXAM: PORTABLE CHEST - 1 VIEW  COMPARISON:  09/10/2013  FINDINGS: Complete opacification of the left hemithorax, unchanged. Abrupt cutoff of the left mainstem bronchus. While the rapid progression favors superimposed atelectasis/infection, an underlying central obstructing mass remains possible.  Multifocal patchy opacities in the right upper and lower lobes, suspicious for pneumonia. No pneumothorax.  Mild leftward cardiomediastinal shift.  IMPRESSION: Multifocal right lung pneumonia.  Complete opacification of the left hemithorax, unchanged. Central obstructing mass is not excluded.   Electronically Signed   By: Charline Bills M.D.   On: 09/11/2013 08:21   Dg Chest Port 1 View  09/10/2013   CLINICAL DATA:  Confirm line placement.  EXAM: PORTABLE CHEST - 1 VIEW  COMPARISON:  09/10/2013  FINDINGS: Right PICC line is in place with the tip at the cavoatrial junction. Complete white out of the left hemithorax, worsened since prior study. Patchy airspace disease in the right lung, increasing since prior study. Very low lung volumes.  IMPRESSION: Right PICC line tip at the cavoatrial junction.  Complete opacification of the left hemithorax and increasing patchy airspace disease in the right lung.   Electronically Signed   By: Charlett Nose M.D.   On: 09/10/2013 15:51     EKG Interpretation   Date/Time:  Friday September 10 2013  07:57:33 EDT Ventricular Rate:  74 PR Interval:  56 QRS Duration: 85 QT Interval:  360 QTC Calculation: 399 R Axis:   89 Text Interpretation:  Sinus rhythm Short PR interval Low voltage,  extremity and precordial leads No acute findings Confirmed by Rhunette Croft,  MD, Janey Genta [phone]) on 09/10/2013 9:43:20 AM      MDM   Final diagnoses:  HCAP (healthcare-associated pneumonia)  Acute respiratory failure with hypoxia  AKI (acute kidney injury)  Acute on chronic systolic congestive heart failure    CRITICAL CARE Performed by: Derwood Kaplan   Total critical care time: 45 minutes  Critical care time was exclusive of separately billable procedures and treating other patients.  Critical care was necessary to treat or prevent imminent or life-threatening deterioration.  Critical care was time spent personally by me on the following activities: development of treatment plan with patient and/or surrogate as well as nursing, discussions with consultants, evaluation of patient's response to treatment,  examination of patient, obtaining history from patient or surrogate, ordering and performing treatments and interventions, ordering and review of laboratory studies, ordering and review of radiographic studies, pulse oximetry and re-evaluation of patient's condition.   Pt comes in with cc of respiratory distress, found to be inhypoxic resp failure. Pt has CHF hx. CXR shows L sided PNA, and thereby pt has HCAP. Also has AKI. Pt has some evidence of fluid overload as well. Pt is DNR, DNI.  Slow IVF started. CCM to admit.   Derwood Kaplan, MD 09/14/13 [phone]

## 2013-09-13 ENCOUNTER — Inpatient Hospital Stay (HOSPITAL_COMMUNITY): Payer: PRIVATE HEALTH INSURANCE

## 2013-09-13 DIAGNOSIS — R5381 Other malaise: Secondary | ICD-10-CM

## 2013-09-13 DIAGNOSIS — R1310 Dysphagia, unspecified: Secondary | ICD-10-CM | POA: Diagnosis present

## 2013-09-13 DIAGNOSIS — Z515 Encounter for palliative care: Secondary | ICD-10-CM

## 2013-09-13 DIAGNOSIS — R0989 Other specified symptoms and signs involving the circulatory and respiratory systems: Secondary | ICD-10-CM

## 2013-09-13 DIAGNOSIS — R5383 Other fatigue: Secondary | ICD-10-CM

## 2013-09-13 DIAGNOSIS — R0609 Other forms of dyspnea: Secondary | ICD-10-CM

## 2013-09-13 DIAGNOSIS — R531 Weakness: Secondary | ICD-10-CM | POA: Diagnosis present

## 2013-09-13 DIAGNOSIS — R0600 Dyspnea, unspecified: Secondary | ICD-10-CM | POA: Diagnosis present

## 2013-09-13 LAB — CULTURE, BLOOD (ROUTINE X 2)

## 2013-09-13 LAB — COMPREHENSIVE METABOLIC PANEL
ALT: 6 U/L (ref 0–35)
AST: 9 U/L (ref 0–37)
Albumin: 2.4 g/dL — ABNORMAL LOW (ref 3.5–5.2)
Alkaline Phosphatase: 87 U/L (ref 39–117)
Anion gap: 12 (ref 5–15)
BILIRUBIN TOTAL: 1 mg/dL (ref 0.3–1.2)
BUN: 42 mg/dL — ABNORMAL HIGH (ref 6–23)
CALCIUM: 8.8 mg/dL (ref 8.4–10.5)
CO2: 31 meq/L (ref 19–32)
CREATININE: 2.68 mg/dL — AB (ref 0.50–1.10)
Chloride: 106 mEq/L (ref 96–112)
GFR calc Af Amer: 18 mL/min — ABNORMAL LOW (ref >90)
GFR, EST NON AFRICAN AMERICAN: 15 mL/min — AB (ref >90)
GLUCOSE: 145 mg/dL — AB (ref 70–99)
Potassium: 3.4 mEq/L — ABNORMAL LOW (ref 3.7–5.3)
Sodium: 149 mEq/L — ABNORMAL HIGH (ref 137–147)
Total Protein: 6.1 g/dL (ref 6.0–8.3)

## 2013-09-13 LAB — GLUCOSE, CAPILLARY
GLUCOSE-CAPILLARY: 141 mg/dL — AB (ref 70–99)
GLUCOSE-CAPILLARY: 114 mg/dL — AB (ref 70–99)
Glucose-Capillary: 148 mg/dL — ABNORMAL HIGH (ref 70–99)
Glucose-Capillary: 111 mg/dL — ABNORMAL HIGH (ref 70–99)

## 2013-09-13 LAB — HEPATIC FUNCTION PANEL
ALBUMIN: 2.4 g/dL — AB (ref 3.5–5.2)
ALT: 5 U/L (ref 0–35)
AST: 10 U/L (ref 0–37)
Alkaline Phosphatase: 74 U/L (ref 39–117)
BILIRUBIN DIRECT: 0.4 mg/dL — AB (ref 0.0–0.3)
BILIRUBIN TOTAL: 1 mg/dL (ref 0.3–1.2)
Indirect Bilirubin: 0.6 mg/dL (ref 0.3–0.9)
Total Protein: 6 g/dL (ref 6.0–8.3)

## 2013-09-13 LAB — MAGNESIUM: Magnesium: 1.7 mg/dL (ref 1.5–2.5)

## 2013-09-13 LAB — CBC
HCT: 24.9 % — ABNORMAL LOW (ref 36.0–46.0)
Hemoglobin: 8.1 g/dL — ABNORMAL LOW (ref 12.0–15.0)
MCH: 26.6 pg (ref 26.0–34.0)
MCHC: 32.5 g/dL (ref 30.0–36.0)
MCV: 81.9 fL (ref 78.0–100.0)
Platelets: 113 10*3/uL — ABNORMAL LOW (ref 150–400)
RBC: 3.04 MIL/uL — AB (ref 3.87–5.11)
RDW: 14.1 % (ref 11.5–15.5)
WBC: 9.6 10*3/uL (ref 4.0–10.5)

## 2013-09-13 MED ORDER — HYDROMORPHONE HCL PF 1 MG/ML IJ SOLN
1.0000 mg | INTRAMUSCULAR | Status: DC | PRN
  Administered 2013-09-13 – 2013-09-15 (×5): 1 mg via INTRAVENOUS
  Filled 2013-09-13 (×5): qty 1

## 2013-09-13 MED ORDER — BISACODYL 10 MG RE SUPP: 10.0000 mg | Freq: Every day | RECTAL | Status: DC | PRN

## 2013-09-13 MED ORDER — STERILE WATER FOR INJECTION IV SOLN
INTRAVENOUS | Status: DC
  Administered 2013-09-13: 11:00:00 via INTRAVENOUS
  Filled 2013-09-13 (×2): qty 9.7

## 2013-09-13 MED ORDER — LORAZEPAM 2 MG/ML IJ SOLN
1.0000 mg | INTRAMUSCULAR | Status: DC | PRN
  Administered 2013-09-15: 1 mg via INTRAVENOUS

## 2013-09-13 MED ORDER — HYDROMORPHONE HCL PF 1 MG/ML IJ SOLN
0.5000 mg | INTRAMUSCULAR | Status: DC | PRN
  Administered 2013-09-13: 1 mg via INTRAVENOUS
  Filled 2013-09-13 (×2): qty 1

## 2013-09-13 MED ORDER — POTASSIUM CHLORIDE 10 MEQ/100ML IV SOLN
10.0000 meq | INTRAVENOUS | Status: AC
  Administered 2013-09-13 (×4): 10 meq via INTRAVENOUS
  Filled 2013-09-13 (×4): qty 100

## 2013-09-13 MED ORDER — LORAZEPAM 2 MG/ML IJ SOLN
1.0000 mg | INTRAMUSCULAR | Status: DC | PRN
  Administered 2013-09-13: 1 mg via INTRAVENOUS
  Filled 2013-09-13 (×2): qty 1

## 2013-09-13 MED ORDER — SCOPOLAMINE 1 MG/3DAYS TD PT72
1.0000 | MEDICATED_PATCH | TRANSDERMAL | Status: DC
  Administered 2013-09-13: 1.5 mg via TRANSDERMAL
  Filled 2013-09-13 (×2): qty 1

## 2013-09-13 NOTE — Progress Notes (Signed)
SLP Cancellation Note  Patient Details Name: MIO SCHELLINGER MRN: [id number] DOB: Jun 18, 1927   Cancelled treatment:       Reason Eval/Treat Not Completed: Medical issues which prohibited therapy (Patient on Bipap. Will f/u 9/8. )  Ferdinand Lango MA, CCC-SLP [phone]  Krishna Dancel Meryl 09/13/2013, 10:54 AM

## 2013-09-13 NOTE — Progress Notes (Addendum)
[facility] PROGRESS NOTE  [name] ZOX:[id number] DOB: 02/06/1927 DOA: 09/10/2013 PCP: Eloisa Northern, MD  Assessment/Plan: Active Problems:   Acute respiratory failure   Acute renal failure   Acidosis    Acute respiratory failure in the setting of bilateral L>R pneumonia: aspiration vs HCAP/sepsis  Patient admitted for recurrent pneumonia likely secondary to aspiration. pt has total opacification of left lung due to mucus plugging, no improvement in serial chest x-ray today Goals of care established by PCCM-do not want intubation, okay with BiPAP , Blood cultures positive for gram-positive cocci in clusters  staph coag negative Continue in SDU , continue broad-spectrum antibiotics  Continue BIPAP until discontinued by family. If no improvement would begin to discuss comfort oriented care Have told her family several times since yesterday the patient's parameters and chest x-ray have not improved She desaturated after being placed on nasal cannula  Negative urine antigens for strep and legionella  procalcitonin protocol  We'll start the patient on ice chips for comfort if the family agrees to place the patient on nasal cannula Have told the family that the patient cannot have anything to eat or drink until the BiPAP was discontinued completely to minimize the risk of aspirating again and making the pneumonia worse  Scheduled BDs  Needs SLP eval if improves.  Family agreeable to at least meeting with palliative care today   palliative care meeting outcome dc BiPap and utilize 4l/nasal cannula, dc pulse oximeter, telemetry and cardiac monitor  -Dilaudid 0.5 mg IV every 15 min prn for pain or dyspnea/ consider continuous gtt ( /hr) if requiring 4 doses over one hr  -Ativan 1 mg IV every 4 hrs prn  -dc daily labs, IV fluids, minimize medications for comfort  -sips and chips as tolerated  -family at bedside  H/o atrial fib -currently in sinus rhythm  H/o Diastolic dysfxn   H/o HTN  H/o severe PVD    SIRS/sepsis in setting of HCAP  Continue SDU  Lactic acid 1.2  IVFs  Tele shows PACs   Acute on chronic renal failure  Baseline 1.8  Slight improvement today  Continue IVFs , sodium bicarbonate drip change IV fluid to 2 amps of bicarbonate and potassium F/u chemistry      +anion gap/NAP Metabolic acidosis: in setting of sepsis and renal failure  Plan  Admit to SDU  Bicarb gtt  Follow labs  Acute encephalopathy superimposed on underlying dementia ? parkinsons dz  Plan  Hold all sedating meds  Supportive care  Cont symmetrel if able   Sacral decub ulcer  Plan  WOC consult   Dysphagia and protein cal malnutrition  Plan  NPO  SLP eval if MS improved and off BiPAP  May need to consider tubefeeds   Iron def anemia and thrombocytopenia  Plan  Transfuse for hgb <7  Trend cbc  Add back feso4 when ms improved    Code Status: full  Family Communication: family updated about patient's clinical progress  Disposition Plan: Transition to comfort care when recommended by critical care  Discussed Beverly Gust Daughter [phone] , by the bedside  Brief narrative:  80 year old female, resides at [facility]. H/o af, severe PAD, PD w/ dementia, prob dysphagia and recent PNA. Admitted from SNF on 9/4 w/ working dx of acute resp failure in setting of HCAP vs aspiration  9/4 admitted to SDU for prob aspiration pneumonia. Goals of care discussion w/ daughter. Full DNR. BIPAP, IVFs, antibiotics. If no improvement w/ in 48 hrs would  be open to comfort oriented care. Daghter's contacts: [name] [phone]   LINES / TUBES:  CULTURES:  BCX2 9/4>>>  UC 89/4>>>  u strep 9/4>>>  u legionella 9/4>>  ANTIBIOTICS:  primaxin 9/4>>>  Vanc 9/4>>>  Consultants:  Critical care Procedures:  None Antibiotics:  Primaxin  Vancomycin  HPI/Subjective:  Patient still on the BiPAP, alert, family by the bedside, addressed all of their concerns. The family  is agreeable to a palliative care consultation   Objective: Filed Vitals:   09/12/13 2340 09/13/13 0047 09/13/13 0411 09/13/13 0809  BP:  174/63 173/52   Pulse: 82 88 81   Temp:  98.8 F (37.1 C) 98.7 F (37.1 C) 98 F (36.7 C)  TempSrc:  Axillary Axillary Axillary  Resp: Height:      Weight:   65.8 kg (145 lb 1 oz)   SpO2:  92% 93% 92%    Intake/Output Summary (Last 24 hours) at 09/13/13 1030 Last data filed at 09/13/13 0700  Gross per 24 hour  Intake   1180 ml  Output    675 ml  Net    505 ml    Exam:  General: alert & oriented x 3 In NAD  Cardiovascular: RRR, nl S1 s2  Respiratory: Decreased breath sounds at the bases, scattered rhonchi, no crackles  Abdomen: soft +BS NT/ND, no masses palpable  Extremities: No cyanosis and no edema      Data Reviewed: Basic Metabolic Panel:  Recent Labs Lab 09/10/13 0830 09/10/13 1640 09/11/13 0448 09/12/13 0540 09/13/13 0433  NA 141  --  144 145 149*  K 4.9  --  4.6 3.8 3.4*  CL 109  --  109 108 106  CO2 16*  --  GLUCOSE 113*  --  91 116* 145*  BUN 43*  --  47* 46* 42*  CREATININE 3.16*  --  3.28* 2.91* 2.68*  CALCIUM 9.1  --  8.4 8.7 8.8  MG  --  1.6  --   --   --   PHOS  --  3.7  --   --   --     Liver Function Tests:  Recent Labs Lab 09/12/13 0540 09/13/13 0433  AST 10 9  ALT 6 6  ALKPHOS 68 87  BILITOT 0.7 1.0  PROT 5.6* 6.1  ALBUMIN 2.3* 2.4*   No results found for this basename: LIPASE, AMYLASE,  in the last 168 hours No results found for this basename: AMMONIA,  in the last 168 hours  CBC:  Recent Labs Lab 09/10/13 0830 09/11/13 0448 09/12/13 0540 09/13/13 0433  WBC 7.5 8.9 7.3 9.6  NEUTROABS 6.6  --   --   --   HGB 10.9* 8.2* 8.0* 8.1*  HCT 33.3* 25.0* 24.8* 24.9*  MCV 82.4 83.9 83.2 81.9  PLT 129* 94* 93* 113*    Cardiac Enzymes:  Recent Labs Lab 09/10/13 0830  TROPONINI <0.30   BNP (last 3 results)  Recent Labs  09/10/13 0830  PROBNP 5121.0*      CBG:  Recent Labs Lab 09/12/13 1552 09/12/13 2019 09/13/13 0044 09/13/13 0417 09/13/13 0823  GLUCAP 140* 131* 148* 141* 114*    Recent Results (from the past 240 hour(s))  CULTURE, BLOOD (ROUTINE X 2)     Status: None   Collection Time    09/10/13 10:01 AM      Result Value Ref Range Status   Specimen Description BLOOD RIGHT FINGER  Final   Special Requests BOTTLES DRAWN AEROBIC AND ANAEROBIC 5CC   Final   Culture  Setup Time     Final   Value: 09/10/2013 14:28     Performed at [facility]   Culture     Final   Value: STAPHYLOCOCCUS SPECIES (COAGULASE NEGATIVE)     Note: SUSCEPTIBILITIES PERFORMED ON PREVIOUS CULTURE WITHIN THE LAST 5 DAYS.     Note: Gram Stain Report Called to,Read Back By and Verified With: MELISSA HAWDESHELL 09/11/13 @ 11:17AM BY RUSCOE     Performed at [facility]   Report Status 09/13/2013 FINAL   Final  CULTURE, BLOOD (ROUTINE X 2)     Status: None   Collection Time    09/10/13 10:10 AM      Result Value Ref Range Status   Specimen Description BLOOD RIGHT THUMB   Final   Special Requests BOTTLES DRAWN AEROBIC AND ANAEROBIC 5CC   Final   Culture  Setup Time     Final   Value: 09/10/2013 14:28     Performed at [facility]   Culture     Final   Value: STAPHYLOCOCCUS SPECIES (COAGULASE NEGATIVE)     Note: RIFAMPIN AND GENTAMICIN SHOULD NOT BE USED AS SINGLE DRUGS FOR TREATMENT OF STAPH INFECTIONS.     Note: Gram Stain Report Called to,Read Back By and Verified With: MELISSA HAWDESHELL 09/11/13 @ 11:17AM BY RUSCOE A.     Performed at [facility]   Report Status 09/13/2013 FINAL   Final   Organism ID, Bacteria STAPHYLOCOCCUS SPECIES (COAGULASE NEGATIVE)   Final  URINE CULTURE     Status: None   Collection Time    09/10/13 12:03 PM      Result Value Ref Range Status   Specimen Description URINE, CATHETERIZED   Final   Special Requests NONE   Final   Culture  Setup Time     Final   Value: 09/10/2013 18:01      Performed at [facility] Count     Final   Value: NO GROWTH     Performed at [facility]   Culture     Final   Value: NO GROWTH     Performed at [facility]   Report Status 09/11/2013 FINAL   Final  MRSA PCR SCREENING     Status: Abnormal   Collection Time    09/11/13  1:06 AM      Result Value Ref Range Status   MRSA by PCR POSITIVE (*) NEGATIVE Final   Comment:            The GeneXpert MRSA Assay (FDA     approved for NASAL specimens     only), is one component of a     comprehensive MRSA colonization     surveillance program. It is not     intended to diagnose MRSA     infection nor to guide or     monitor treatment for     MRSA infections.     RESULT CALLED TO, READ BACK BY AND VERIFIED WITHLuther Redo RN [id number] [phone] GREEN R     Studies: Dg Chest Port 1 View  09/13/2013   CLINICAL DATA:  Pneumonia  EXAM: PORTABLE CHEST - 1 VIEW  COMPARISON:  09/11/2013  FINDINGS: Left hemithorax remains completely opacified. Worsened airspace opacities in the right lung. Right-sided PICC line tip: SVC. Atherosclerotic aortic  arch. Degenerative arthropathy at the glenohumeral joints. Thoracic spondylosis.  IMPRESSION: 1. Continued complete opacification of the left hemithorax whether by diffuse consolidation or atelectasis. Worsening multifocal pneumonia in the right lung.   Electronically Signed   By: Herbie Baltimore M.D.   On: 09/13/2013 09:47   Dg Chest Port 1 View  09/11/2013   CLINICAL DATA:  Follow-up pneumonia  EXAM: PORTABLE CHEST - 1 VIEW  COMPARISON:  09/10/2013  FINDINGS: Complete opacification of the left hemithorax, unchanged. Abrupt cutoff of the left mainstem bronchus. While the rapid progression favors superimposed atelectasis/infection, an underlying central obstructing mass remains possible.  Multifocal patchy opacities in the right upper and lower lobes, suspicious for pneumonia. No pneumothorax.  Mild leftward cardiomediastinal shift.   IMPRESSION: Multifocal right lung pneumonia.  Complete opacification of the left hemithorax, unchanged. Central obstructing mass is not excluded.   Electronically Signed   By: Charline Bills M.D.   On: 09/11/2013 08:21   Dg Chest Port 1 View  09/10/2013   CLINICAL DATA:  Confirm line placement.  EXAM: PORTABLE CHEST - 1 VIEW  COMPARISON:  09/10/2013  FINDINGS: Right PICC line is in place with the tip at the cavoatrial junction. Complete white out of the left hemithorax, worsened since prior study. Patchy airspace disease in the right lung, increasing since prior study. Very low lung volumes.  IMPRESSION: Right PICC line tip at the cavoatrial junction.  Complete opacification of the left hemithorax and increasing patchy airspace disease in the right lung.   Electronically Signed   By: Charlett Nose M.D.   On: 09/10/2013 15:51   Dg Chest Port 1 View  09/10/2013   CLINICAL DATA:  Respiratory distress.  EXAM: PORTABLE CHEST - 1 VIEW  COMPARISON:  June 26, 2010.  FINDINGS: Stable cardiomediastinal silhouette. Large left upper lobe airspace opacity is noted consistent with pneumonia. Mild right upper lobe opacity is noted concerning for pneumonia. Narrowing of the right subacromial space is noted suggesting rotator cuff injury. No pneumothorax or significant pleural effusion is noted.  IMPRESSION: New large left upper lobe pneumonia. Mild right upper lobe opacity is noted concerning for pneumonia.   Electronically Signed   By: Roque Lias M.D.   On: 09/10/2013 08:36    Scheduled Meds: . albuterol  2.5 mg Nebulization Q4H  . amantadine  100 mg Oral Daily  . antiseptic oral rinse  7 mL Mouth Rinse q12n4p  . chlorhexidine  15 mL Mouth Rinse BID  . Chlorhexidine Gluconate Cloth  6 each Topical Q0600  . heparin  5,000 Units Subcutaneous 3 times per day  . imipenem-cilastatin  250 mg Intravenous Q12H  . insulin aspart  0-9 Units Subcutaneous 6 times per day  . mupirocin ointment  1 application Nasal BID  .  potassium chloride  10 mEq Intravenous Q1 Hr x 4  . sodium chloride  10-40 mL Intracatheter Q12H  . vancomycin  1,000 mg Intravenous Q48H   Continuous Infusions: .  sodium bicarbonate infusion 1/4 NS 1000 mL      Active Problems:   Acute respiratory failure   Acute renal failure   Acidosis    Time spent: 40 minutes   [facility]  [facility] Pager [phone]. If 7PM-7AM, please contact night-coverage at [email or web address], password [facility] 09/13/2013, 10:30 AM  LOS: 3 days

## 2013-09-13 NOTE — Progress Notes (Signed)
Removed bipap and placed pt on 4L02 nasal cannula. Ativan given to help pt relax and decrease work of breathing. No c/o pain at this time. Family at bedside. Provided comfort and emotional support.

## 2013-09-13 NOTE — Progress Notes (Signed)
Chaplain was paged by nurse that family was all gathered and would like for the chaplain to come and pray with family as decision has been made to take pt off ventilator. Chaplain  Provided prayer and comfort to family.   Cindie Crumbly, [address]

## 2013-09-13 NOTE — Consult Note (Signed)
Patient [name] [name]      DOB: [birth year]      VWU:[id number]     Consult Note from the Palliative Medicine Team at [facility]    Consult Requested by: Dr Susie Cassette     PCP: Eloisa Northern, MD Reason for Consultation:Clarification of GOC and options     Phone Number:[phone]  Assessment of patients Current state: 80 yo female who at baseline is total care lives in SNF [facility]) with mild dementia, continued slow physical ( protein calorie malnutrition, decubitus ulcers), functional and cognitive decline admitted with  Acute respiratory failure in setting of pneumonia,  aspiration vs HCAP.  Family is faced with advanced directive decisions and anticipatory care needs.  Consult is for review of medical treatment options, clarification of goals of care and end of life issues, disposition and options, and symptom recommendation.  This NP Lorinda Creed reviewed medical records, received report from team, assessed the patient and then meet at the patient's bedside along with her son/Michael, daughters Gardiner Ramus and Karin Golden, and several other family members to discuss diagnosis prognosis, GOC, EOL wishes disposition and options.  A detailed discussion was had today regarding advanced directives.  Concepts specific to code status, artifical feeding and hydration, continued IV antibiotics and rehospitalization was had.  The difference between a aggressive medical intervention path  and a palliative comfort care path for this patient at this time was had.  Values and goals of care important to patient and family were attempted to be elicited.  Concept of Hospice and Palliative Care were discussed  Natural trajectory and expectations at EOL were discussed.  Questions and concerns addressed.  Hard Choices booklet left for review. Family encouraged to call with questions or concerns.  PMT will continue to support holistically.   Goals of Care: 1.  Code Status:  DNR/DNI-comfort is main  focus of care   2. Scope of Treatment:  Nursing to call Dr Susie Cassette. When family arrive (6pm tonight) transition to full comfort:  -dc BiPap and utilize 4l/nasal cannula, dc pulse oximeter, telemetry and cardiac monitor -Dilaudid 0.5 mg IV every 15 min prn for pain or dyspnea/ consider continuous gtt ( /hr) if requiring 4 doses over one hr  -Ativan 1 mg IV every 4 hrs prn -dc daily labs, IV fluids, minimize medications for comfort -sips and chips as tolerated -family at bedside  3. Disposition:  Dependant on outcomes, patient will likely decompensate quickly Recommend patient to remain in current room until re-evaluation in the morning.  If she stabilizes may need in-patient hospice facility  4. Symptom Management:   1. Anxiety/Agitation:  Ativan 1 mg IV every  4 hrs prn 2. Pain/Dyspnea: Dilaudid 0.5 mg IV every 15 min prn 3. Dysphagia; comfort feeds and fluids as tolerated, family aware of aspiration risk 4. Bowel Regimen:Dulcolax supp prn  5. Psychosocial:  Emotional support offered to family at bedside.  All understand the overall poor prognosis  6. Spiritual:  Chaplain consulted     Patient Documents Completed or Given: Document Given Completed  Advanced Directives Pkt    MOST    DNR    Gone from My [facility]ight    Hard Choices yes     Brief HPI:  80 year old female, resides at SNF. H/o af, severe PAD, PD w/ dementia, prob dysphagia and recent PNA. Admitted from SNF on 9/4 w/ working dx of acute resp failure in setting of HCAP vs aspiration.   Continues to require BiPAP for respiratory support and family  now desire liberation from BiPap and to allow a more natural course understanding the prognosis will be limited likely hrs to days.   ROS:  Unable to illict due to lethargy and BiPap   PMH:  Past Medical History  Diagnosis Date  . CVA (cerebral infarction)   . Diastolic heart failure   . Anemia   . Atrial fibrillation   . Acute on chronic kidney disease, stage 3   .  Hypertension   . Hx of BKA   . Kidney stones   . Pancreatitis   . Urinary tract infection      PSH: Past Surgical History  Procedure Laterality Date  . Below knee leg amputation     I have reviewed the FH and SH and  If appropriate update it with new information. Allergies  Allergen Reactions  . Iodinated Diagnostic Agents     Per MAR  . Penicillins     Per  MAR   Scheduled Meds: . albuterol  2.5 mg Nebulization Q4H  . amantadine  100 mg Oral Daily  . antiseptic oral rinse  7 mL Mouth Rinse q12n4p  . chlorhexidine  15 mL Mouth Rinse BID  . Chlorhexidine Gluconate Cloth  6 each Topical Q0600  . heparin  5,000 Units Subcutaneous 3 times per day  . imipenem-cilastatin  250 mg Intravenous Q12H  . insulin aspart  0-9 Units Subcutaneous 6 times per day  . mupirocin ointment  1 application Nasal BID  . sodium chloride  10-40 mL Intracatheter Q12H  . vancomycin  1,000 mg Intravenous Q48H   Continuous Infusions: .  sodium bicarbonate infusion 1/4 NS 1000 mL 75 mL/hr at 09/13/13 1122   PRN Meds:.LORazepam, ondansetron (ZOFRAN) IV, sodium chloride    BP 159/59  Pulse 77  Temp(Src) 97.8 F (36.6 C) (Axillary)  Resp 19  Ht  (1.6 m)  Wt 65.8 kg (145 lb 1 oz)  BMI 25.70 kg/m2  SpO2 88%   PPS:30 % at best   Intake/Output Summary (Last 24 hours) at 09/13/13 1501 Last data filed at 09/13/13 1253  Gross per 24 hour  Intake    555 ml  Output    725 ml  Net   -170 ml    Physical Exam:  General: opens eyes and follows simple commands Chest:  Decreased in bases, decreased L>Rscattered coarse BS CVS: RRR Abdomen: soft NT decreased BS Ext: L- AKA, right leg without edema, Left arm contracted in splint   Labs: CBC    Component Value Date/Time   WBC 9.6 09/13/2013 0433   RBC 3.04* 09/13/2013 0433   HGB 8.1* 09/13/2013 0433   HCT 24.9* 09/13/2013 0433   PLT 113* 09/13/2013 0433   MCV 81.9 09/13/2013 0433   MCH 26.6 09/13/2013 0433   MCHC 32.5 09/13/2013 0433   RDW 14.1  09/13/2013 0433   LYMPHSABS 0.5* 09/10/2013 0830   MONOABS 0.2 09/10/2013 0830   EOSABS 0.1 09/10/2013 0830   BASOSABS 0.0 09/10/2013 0830    BMET    Component Value Date/Time   NA 149* 09/13/2013 0433   K 3.4* 09/13/2013 0433   CL 106 09/13/2013 0433   CO2 31 09/13/2013 0433   GLUCOSE 145* 09/13/2013 0433   BUN 42* 09/13/2013 0433   CREATININE 2.68* 09/13/2013 0433   CALCIUM 8.8 09/13/2013 0433   GFRNONAA 15* 09/13/2013 0433   GFRAA 18* 09/13/2013 0433    CMP     Component Value Date/Time   NA 149* 09/13/2013 4098  K 3.4* 09/13/2013 0433   CL 106 09/13/2013 0433   CO2 31 09/13/2013 0433   GLUCOSE 145* 09/13/2013 0433   BUN 42* 09/13/2013 0433   CREATININE 2.68* 09/13/2013 0433   CALCIUM 8.8 09/13/2013 0433   PROT 6.1 09/13/2013 0433   ALBUMIN 2.4* 09/13/2013 0433   AST 9 09/13/2013 0433   ALT 6 09/13/2013 0433   ALKPHOS 87 09/13/2013 0433   BILITOT 1.0 09/13/2013 0433   GFRNONAA 15* 09/13/2013 0433   GFRAA 18* 09/13/2013 0433    DG Chest 09-13-13  Left hemithorax remains completely opacified. Worsened airspace  opacities in the right lung. Right-sided PICC line tip: SVC.  Atherosclerotic aortic arch. Degenerative arthropathy at the  glenohumeral joints. Thoracic spondylosis.  IMPRESSION:  1. Continued complete opacification of the left hemithorax whether  by diffuse consolidation or atelectasis. Worsening multifocal  pneumonia in the right lung.     Time In Time Out Total Time Spent with Patient Total Overall Time  1400 1530 80 min 90 min    Greater than 50%  of this time was spent counseling and coordinating care related to the above assessment and plan.   Lorinda Creed NP  Palliative Medicine Team Team Phone # [phone] Pager [phone]       Cell # [phone]  Discussed with Dr Susie Cassette

## 2013-09-13 NOTE — Consult Note (Signed)
I have reviewed and discussed case with Nurse Practitioner And agree with documentation and plan as noted above   Jamisen Duerson J. Caralee Morea D.O.  Palliative Medicine Team at [place]  Team Phone: [phone]    

## 2013-09-13 NOTE — Progress Notes (Signed)
ANTIBIOTIC CONSULT NOTE Pharmacy Consult for vancomycin and imipenem Indication: pneumonia  Allergies  Allergen Reactions  . Iodinated Diagnostic Agents     Per MAR  . Penicillins     Per  [facility]    Patient Measurements: Height:  (160 cm) (pt said 40f7i) Weight: 145 lb 1 oz (65.8 kg) IBW/kg (Calculated) : 52.4  Body Weight: 81.6 kg  Vital Signs: Temp: 98 F (36.7 C) (09/07 0809) Temp src: Axillary (09/07 0809) BP: 173/52 mmHg (09/07 0411) Pulse Rate: 81 (09/07 0411) Intake/Output from previous day: 09/06 0701 - 09/07 0700 In: 1655 [I.V.:1155; IV Piggyback:500] Out: 675 [Urine:675] Intake/Output from this shift:    Labs:  Recent Labs  09/11/13 0448 09/12/13 0540 09/13/13 0433  WBC 8.9 7.3 9.6  HGB 8.2* 8.0* 8.1*  PLT 94* 93* 113*  CREATININE 3.28* 2.91* 2.68*   Estimated Creatinine Clearance: 14.5 ml/min (by C-G formula based on Cr of 2.68). No results found for this basename: VANCOTROUGH, Leodis Binet, VANCORANDOM, GENTTROUGH, GENTPEAK, GENTRANDOM, TOBRATROUGH, TOBRAPEAK, TOBRARND, AMIKACINPEAK, AMIKACINTROU, AMIKACIN,  in the last 72 hours   Microbiology: Recent Results (from the past 720 hour(s))  CULTURE, BLOOD (ROUTINE X 2)     Status: None   Collection Time    09/10/13 10:01 AM      Result Value Ref Range Status   Specimen Description BLOOD RIGHT FINGER   Final   Special Requests BOTTLES DRAWN AEROBIC AND ANAEROBIC 5CC   Final   Culture  Setup Time     Final   Value: 09/10/2013 14:28     Performed at [facility]   Culture     Final   Value: STAPHYLOCOCCUS SPECIES (COAGULASE NEGATIVE)     Note: SUSCEPTIBILITIES PERFORMED ON PREVIOUS CULTURE WITHIN THE LAST 5 DAYS.     Note: Gram Stain Report Called to,Read Back By and Verified With: MELISSA HAWDESHELL 09/11/13 @ 11:17AM BY RUSCOE     Performed at [facility]   Report Status 09/13/2013 FINAL   Final  CULTURE, BLOOD (ROUTINE X 2)     Status: None   Collection Time    09/10/13 10:10 AM       Result Value Ref Range Status   Specimen Description BLOOD RIGHT THUMB   Final   Special Requests BOTTLES DRAWN AEROBIC AND ANAEROBIC 5CC   Final   Culture  Setup Time     Final   Value: 09/10/2013 14:28     Performed at [facility]   Culture     Final   Value: STAPHYLOCOCCUS SPECIES (COAGULASE NEGATIVE)     Note: RIFAMPIN AND GENTAMICIN SHOULD NOT BE USED AS SINGLE DRUGS FOR TREATMENT OF STAPH INFECTIONS.     Note: Gram Stain Report Called to,Read Back By and Verified With: MELISSA HAWDESHELL 09/11/13 @ 11:17AM BY RUSCOE A.     Performed at [facility]   Report Status 09/13/2013 FINAL   Final   Organism ID, Bacteria STAPHYLOCOCCUS SPECIES (COAGULASE NEGATIVE)   Final  URINE CULTURE     Status: None   Collection Time    09/10/13 12:03 PM      Result Value Ref Range Status   Specimen Description URINE, CATHETERIZED   Final   Special Requests NONE   Final   Culture  Setup Time     Final   Value: 09/10/2013 18:01     Performed at [facility] Count     Final   Value: NO GROWTH  Performed at [facility]     Final   Value: NO GROWTH     Performed at [facility]   Report Status 09/11/2013 FINAL   Final  MRSA PCR SCREENING     Status: Abnormal   Collection Time    09/11/13  1:06 AM      Result Value Ref Range Status   MRSA by PCR POSITIVE (*) NEGATIVE Final   Comment:            The GeneXpert MRSA Assay (FDA     approved for NASAL specimens     only), is one component of a     comprehensive MRSA colonization     surveillance program. It is not     intended to diagnose MRSA     infection nor to guide or     monitor treatment for     MRSA infections.     RESULT CALLED TO, READ BACK BY AND VERIFIED WITHLuther Redo RN [id number] [phone] GREEN R   Assessment: 80 yo lady to start broad spectrum antibiotics for PNA.    Patient is currently afeb with normal wbc. Last PCT was still greatly elevated at 31. She  continues on day #3 of broad abx that have been adjusted for her renal dysfunction which is slowly improving. Coag negative staph bacteremia noted from cultures, do not feel antibiotic changes are prudent at this time as well as limited sensitivities. Patient does continue on bipap and family is agreeable to meeting with palliative care.  9/4 bld x2 - coag negative staph -sensitive to clinda, septra, vanc  Goal of Therapy:  Vancomycin trough level 15-20 mcg/ml  Plan:  Cont vancomycin 1gm IV q48 hours -consider random level prior to redosing 9/8 Primaxin 250 mg IV q12 hours F/u cultures, clinical course and renal function.  Sheppard Coil PharmD., BCPS Clinical Pharmacist Pager [phone] 09/13/2013 10:42 AM

## 2013-09-13 NOTE — Progress Notes (Signed)
Chaplains McCray and Amity visited with the pt and family. Two members of the family informed Chaplains that majority of the family was currently eating in the cafe and would be all together roughly around 5:30pm. Chaplain informed the members present that another Chaplain would be present to visit with them as they needed. Family present expressed gratitude. Nursing staff also made aware. Will page if needed.  Gala Romney 09/13/2013 4:13 PM

## 2013-09-14 LAB — COMPREHENSIVE METABOLIC PANEL
ANION GAP: 12 (ref 5–15)
AST: 8 U/L (ref 0–37)
Albumin: 2.3 g/dL — ABNORMAL LOW (ref 3.5–5.2)
Alkaline Phosphatase: 67 U/L (ref 39–117)
BUN: 44 mg/dL — ABNORMAL HIGH (ref 6–23)
CO2: 32 meq/L (ref 19–32)
Calcium: 8.8 mg/dL (ref 8.4–10.5)
Chloride: 107 mEq/L (ref 96–112)
Creatinine, Ser: 2.89 mg/dL — ABNORMAL HIGH (ref 0.50–1.10)
GFR calc Af Amer: 16 mL/min — ABNORMAL LOW (ref >90)
GFR, EST NON AFRICAN AMERICAN: 14 mL/min — AB (ref >90)
GLUCOSE: 91 mg/dL (ref 70–99)
Potassium: 3.9 mEq/L (ref 3.7–5.3)
SODIUM: 151 meq/L — AB (ref 137–147)
Total Bilirubin: 0.7 mg/dL (ref 0.3–1.2)
Total Protein: 6 g/dL (ref 6.0–8.3)

## 2013-09-14 NOTE — Progress Notes (Signed)
[facility] PROGRESS NOTE  [name] ZOX:[id number] DOB: [birth year] DOA: 09/10/2013 PCP: Eloisa Northern, MD  Assessment/Plan: Active Problems:   Acute respiratory failure   Acute renal failure   Acidosis   Palliative care encounter   Dyspnea   Dysphagia, unspecified(787.20)   Weakness generalized   Acute respiratory failure in the setting of bilateral L>R pneumonia: aspiration vs HCAP/sepsis  Patient admitted for recurrent pneumonia likely secondary to aspiration. pt has total opacification of left lung due to mucus plugging, no improvement in serial chest x-ray today  Goals of care established by PCCM-do not want intubation, okay with BiPAP ,  Blood cultures positive for gram-positive cocci in clusters staph coag negative  Transferred to MedSurg, continue broad-spectrum antibiotics   Negative urine antigens for strep and legionella  procalcitonin protocol discontinued, last discontinued SLP evaluation pending Needs SLP eval if improves.  Transition to a more comfort approach, however still receiving IV antibiotics Good continue for another 4 days and then discontinue   palliative care meeting outcome  dc BiPap and utilize 4l/nasal cannula, dc pulse oximeter, telemetry and cardiac monitor  -Dilaudid 0.5 mg IV every 15 min prn for pain or dyspnea/ consider continuous gtt ( /hr) if requiring 4 doses over one hr  -Ativan 1 mg IV every 4 hrs prn  -dc daily labs, IV fluids, minimize medications for comfort  -sips and chips as tolerated  -family at bedside   DO NOT RESUSCITATE  H/o atrial fib -currently in sinus rhythm  H/o Diastolic dysfxn  H/o HTN  H/o severe PVD    SIRS/sepsis in setting of HCAP  MedSurg Lactic acid 1.2  IVFs  Tele shows PACs  Acute on chronic renal failure  Baseline 1.8  Slight improvement today  Continue IVFs , sodium bicarbonate drip change IV fluid to 2 amps of bicarbonate and potassium  F/u chemistry    +anion gap/NAP Metabolic  acidosis: in setting of sepsis and renal failure  Plan  Admit to SDU  Bicarb gtt  Follow labs  Acute encephalopathy superimposed on underlying dementia ? parkinsons dz  Plan  Hold all sedating meds  Supportive care  Cont symmetrel if able  Sacral decub ulcer  Plan  WOC consult  Dysphagia and protein cal malnutrition  Plan  NPO  SLP eval if MS improved and off BiPAP  May need to consider tubefeeds  Iron def anemia and thrombocytopenia  Plan  Transfuse for hgb <7  Trend cbc  Add back feso4 when ms improved    Code Status: DO NOT RESUSCITATE Family Communication: family updated about patient's clinical progress  Disposition Plan: Transition to comfort care when recommended by critical care  Discussed Beverly Gust Daughter [phone] , by the bedside   Brief narrative:  80 year old female, resides at [facility]. H/o af, severe PAD, PD w/ dementia, prob dysphagia and recent PNA. Admitted from SNF on 9/4 w/ working dx of acute resp failure in setting of HCAP vs aspiration  9/4 admitted to SDU for prob aspiration pneumonia. Goals of care discussion w/ daughter. Full DNR. BIPAP, IVFs, antibiotics. If no improvement w/ in 48 hrs would be open to comfort oriented care. Daghter's contacts: [name] [phone]   LINES / TUBES:  CULTURES:  BCX2 9/4>>>  UC 89/4>>>  u strep 9/4>>>  u legionella 9/4>>   ANTIBIOTICS:  primaxin 9/4>>>  Vanc 9/4>>>   Consultants:  Critical care Procedures:  None Antibiotics:  Primaxin  Vancomycin    HPI/Subjective:  Patient appeared comfortable, resting  Objective: Filed Vitals:   09/13/13 1609 09/13/13 2330 09/14/13 0306 09/14/13 0915  BP:      Pulse:      Temp:      TempSrc:      Resp:      Height:      Weight:      SpO2: 90% 88% 87% 77%    Intake/Output Summary (Last 24 hours) at 09/14/13 1045 Last data filed at 09/13/13 2152  Gross per 24 hour  Intake    200 ml  Output    200 ml  Net      0 ml     Exam:  General: alert & oriented x 3 In NAD  Cardiovascular: RRR, nl S1 s2  Respiratory: Decreased breath sounds at the bases, scattered rhonchi, no crackles  Abdomen: soft +BS NT/ND, no masses palpable  Extremities: No cyanosis and no edema      Data Reviewed: Basic Metabolic Panel:  Recent Labs Lab 09/10/13 0830 09/10/13 1640 09/11/13 0448 09/12/13 0540 09/13/13 0430 09/13/13 0433 09/14/13 0530  NA 141  --  144 145  --  149* 151*  K 4.9  --  4.6 3.8  --  3.4* 3.9  CL 109  --  109 108  --  106 107  CO2 16*  --  23 26  --  31 32  GLUCOSE 113*  --  91 116*  --  145* 91  BUN 43*  --  47* 46*  --  42* 44*  CREATININE 3.16*  --  3.28* 2.91*  --  2.68* 2.89*  CALCIUM 9.1  --  8.4 8.7  --  8.8 8.8  MG  --  1.6  --   --  1.7  --   --   PHOS  --  3.7  --   --   --   --   --     Liver Function Tests:  Recent Labs Lab 09/12/13 0540 09/13/13 0430 09/13/13 0433 09/14/13 0530  AST ALT <5  ALKPHOS 68 74 87 67  BILITOT 0.7 1.0 1.0 0.7  PROT 5.6* 6.0 6.1 6.0  ALBUMIN 2.3* 2.4* 2.4* 2.3*   No results found for this basename: LIPASE, AMYLASE,  in the last 168 hours No results found for this basename: AMMONIA,  in the last 168 hours  CBC:  Recent Labs Lab 09/10/13 0830 09/11/13 0448 09/12/13 0540 09/13/13 0433  WBC 7.5 8.9 7.3 9.6  NEUTROABS 6.6  --   --   --   HGB 10.9* 8.2* 8.0* 8.1*  HCT 33.3* 25.0* 24.8* 24.9*  MCV 82.4 83.9 83.2 81.9  PLT 129* 94* 93* 113*    Cardiac Enzymes:  Recent Labs Lab 09/10/13 0830  TROPONINI <0.30   BNP (last 3 results)  Recent Labs  09/10/13 0830  PROBNP 5121.0*     CBG:  Recent Labs Lab 09/12/13 2019 09/13/13 0044 09/13/13 0417 09/13/13 0823 09/13/13 1236  GLUCAP 131* 148* 141* 114* 111*    Recent Results (from the past 240 hour(s))  CULTURE, BLOOD (ROUTINE X 2)     Status: None   Collection Time    09/10/13 10:01 AM      Result Value Ref Range Status   Specimen Description  BLOOD RIGHT FINGER   Final   Special Requests BOTTLES DRAWN AEROBIC AND ANAEROBIC 5CC   Final   Culture  Setup Time     Final   Value: 09/10/2013  14:28     Performed at [facility]     Final   Value: STAPHYLOCOCCUS SPECIES (COAGULASE NEGATIVE)     Note: SUSCEPTIBILITIES PERFORMED ON PREVIOUS CULTURE WITHIN THE LAST 5 DAYS.     Note: Gram Stain Report Called to,Read Back By and Verified With: MELISSA HAWDESHELL 09/11/13 @ 11:17AM BY RUSCOE     Performed at [facility]   Report Status 09/13/2013 FINAL   Final  CULTURE, BLOOD (ROUTINE X 2)     Status: None   Collection Time    09/10/13 10:10 AM      Result Value Ref Range Status   Specimen Description BLOOD RIGHT THUMB   Final   Special Requests BOTTLES DRAWN AEROBIC AND ANAEROBIC 5CC   Final   Culture  Setup Time     Final   Value: 09/10/2013 14:28     Performed at [facility]   Culture     Final   Value: STAPHYLOCOCCUS SPECIES (COAGULASE NEGATIVE)     Note: RIFAMPIN AND GENTAMICIN SHOULD NOT BE USED AS SINGLE DRUGS FOR TREATMENT OF STAPH INFECTIONS.     Note: Gram Stain Report Called to,Read Back By and Verified With: MELISSA HAWDESHELL 09/11/13 @ 11:17AM BY RUSCOE A.     Performed at [facility]   Report Status 09/13/2013 FINAL   Final   Organism ID, Bacteria STAPHYLOCOCCUS SPECIES (COAGULASE NEGATIVE)   Final  URINE CULTURE     Status: None   Collection Time    09/10/13 12:03 PM      Result Value Ref Range Status   Specimen Description URINE, CATHETERIZED   Final   Special Requests NONE   Final   Culture  Setup Time     Final   Value: 09/10/2013 18:01     Performed at [facility] Count     Final   Value: NO GROWTH     Performed at [facility]   Culture     Final   Value: NO GROWTH     Performed at [facility]   Report Status 09/11/2013 FINAL   Final  MRSA PCR SCREENING     Status: Abnormal   Collection Time    09/11/13  1:06 AM      Result  Value Ref Range Status   MRSA by PCR POSITIVE (*) NEGATIVE Final   Comment:            The GeneXpert MRSA Assay (FDA     approved for NASAL specimens     only), is one component of a     comprehensive MRSA colonization     surveillance program. It is not     intended to diagnose MRSA     infection nor to guide or     monitor treatment for     MRSA infections.     RESULT CALLED TO, READ BACK BY AND VERIFIED WITHLuther Redo RN [id number] [phone] GREEN R     Studies: Dg Chest Port 1 View  09/13/2013   CLINICAL DATA:  Pneumonia  EXAM: PORTABLE CHEST - 1 VIEW  COMPARISON:  09/11/2013  FINDINGS: Left hemithorax remains completely opacified. Worsened airspace opacities in the right lung. Right-sided PICC line tip: SVC. Atherosclerotic aortic arch. Degenerative arthropathy at the glenohumeral joints. Thoracic spondylosis.  IMPRESSION: 1. Continued complete opacification of the left hemithorax whether by diffuse consolidation or atelectasis. Worsening multifocal pneumonia in  the right lung.   Electronically Signed   By: Herbie Baltimore M.D.   On: 09/13/2013 09:47   Dg Chest Port 1 View  09/11/2013   CLINICAL DATA:  Follow-up pneumonia  EXAM: PORTABLE CHEST - 1 VIEW  COMPARISON:  09/10/2013  FINDINGS: Complete opacification of the left hemithorax, unchanged. Abrupt cutoff of the left mainstem bronchus. While the rapid progression favors superimposed atelectasis/infection, an underlying central obstructing mass remains possible.  Multifocal patchy opacities in the right upper and lower lobes, suspicious for pneumonia. No pneumothorax.  Mild leftward cardiomediastinal shift.  IMPRESSION: Multifocal right lung pneumonia.  Complete opacification of the left hemithorax, unchanged. Central obstructing mass is not excluded.   Electronically Signed   By: Charline Bills M.D.   On: 09/11/2013 08:21   Dg Chest Port 1 View  09/10/2013   CLINICAL DATA:  Confirm line placement.  EXAM: PORTABLE CHEST - 1 VIEW   COMPARISON:  09/10/2013  FINDINGS: Right PICC line is in place with the tip at the cavoatrial junction. Complete white out of the left hemithorax, worsened since prior study. Patchy airspace disease in the right lung, increasing since prior study. Very low lung volumes.  IMPRESSION: Right PICC line tip at the cavoatrial junction.  Complete opacification of the left hemithorax and increasing patchy airspace disease in the right lung.   Electronically Signed   By: Charlett Nose M.D.   On: 09/10/2013 15:51   Dg Chest Port 1 View  09/10/2013   CLINICAL DATA:  Respiratory distress.  EXAM: PORTABLE CHEST - 1 VIEW  COMPARISON:  June 26, 2010.  FINDINGS: Stable cardiomediastinal silhouette. Large left upper lobe airspace opacity is noted consistent with pneumonia. Mild right upper lobe opacity is noted concerning for pneumonia. Narrowing of the right subacromial space is noted suggesting rotator cuff injury. No pneumothorax or significant pleural effusion is noted.  IMPRESSION: New large left upper lobe pneumonia. Mild right upper lobe opacity is noted concerning for pneumonia.   Electronically Signed   By: Roque Lias M.D.   On: 09/10/2013 08:36    Scheduled Meds: . albuterol  2.5 mg Nebulization Q4H  . amantadine  100 mg Oral Daily  . antiseptic oral rinse  7 mL Mouth Rinse q12n4p  . chlorhexidine  15 mL Mouth Rinse BID  . Chlorhexidine Gluconate Cloth  6 each Topical Q0600  . imipenem-cilastatin  250 mg Intravenous Q12H  . mupirocin ointment  1 application Nasal BID  . scopolamine  1 patch Transdermal Q72H  . sodium chloride  10-40 mL Intracatheter Q12H  . vancomycin  1,000 mg Intravenous Q48H   Continuous Infusions:   Active Problems:   Acute respiratory failure   Acute renal failure   Acidosis   Palliative care encounter   Dyspnea   Dysphagia, unspecified(787.20)   Weakness generalized    Time spent: 40 minutes   [facility]  [facility] Pager [phone]. If 7PM-7AM, please  contact night-coverage at [email or web address], password [facility] 09/14/2013, 10:45 AM  LOS: 4 days

## 2013-09-14 NOTE — Progress Notes (Signed)
SLP Cancellation Note  Patient Details Name: [name] MRN: [id number] DOB: 19-Aug-1927   Cancelled treatment:    Palliative care notes reviewed.  SLP services to sign off at this time.       Blenda Mounts Laurice 09/14/2013, 10:35 AM

## 2013-09-14 NOTE — Progress Notes (Signed)
Progress Note from the Palliative Medicine Team at [facility]  Subjective:  -patient is lethargic, awakens to verbal stimuli, appears comfortable, NAD  -daughter Gardiner Ramus at bedside, continued conversation regarding natural trajectory and expectations at EOL   Objective: Allergies  Allergen Reactions  . Iodinated Diagnostic Agents     Per MAR  . Penicillins     Per  MAR   Scheduled Meds: . albuterol  2.5 mg Nebulization Q4H  . amantadine  100 mg Oral Daily  . antiseptic oral rinse  7 mL Mouth Rinse q12n4p  . chlorhexidine  15 mL Mouth Rinse BID  . Chlorhexidine Gluconate Cloth  6 each Topical Q0600  . imipenem-cilastatin  250 mg Intravenous Q12H  . mupirocin ointment  1 application Nasal BID  . scopolamine  1 patch Transdermal Q72H  . sodium chloride  10-40 mL Intracatheter Q12H  . vancomycin  1,000 mg Intravenous Q48H   Continuous Infusions:  PRN Meds:.bisacodyl, HYDROmorphone (DILAUDID) injection, LORazepam, LORazepam, ondansetron (ZOFRAN) IV, sodium chloride  BP 147/64  Pulse 117  Temp(Src) 99.6 F (37.6 C) (Oral)  Resp 18  Ht  (1.6 m)  Wt 66.8 kg (147 lb 4.3 oz)  BMI 26.09 kg/m2  SpO2 74%   PPS:20 %     Intake/Output Summary (Last 24 hours) at 09/14/13 1437 Last data filed at 09/14/13 1147  Gross per 24 hour  Intake    500 ml  Output    450 ml  Net     50 ml       Physical Exam:  General: opens eyes and follows simple commands  Chest: Decreased in bases, decreased L>Rscattered coarse BS  CVS: Tachycardic Abdomen: soft NT decreased BS  Ext: L- AKA, right leg without edema, Left arm contracted in splint  Labs: CBC    Component Value Date/Time   WBC 9.6 09/13/2013 0433   RBC 3.04* 09/13/2013 0433   HGB 8.1* 09/13/2013 0433   HCT 24.9* 09/13/2013 0433   PLT 113* 09/13/2013 0433   MCV 81.9 09/13/2013 0433   MCH 26.6 09/13/2013 0433   MCHC 32.5 09/13/2013 0433   RDW 14.1 09/13/2013 0433   LYMPHSABS 0.5* 09/10/2013 0830   MONOABS 0.2 09/10/2013 0830   EOSABS 0.1  09/10/2013 0830   BASOSABS 0.0 09/10/2013 0830    BMET    Component Value Date/Time   NA 151* 09/14/2013 0530   K 3.9 09/14/2013 0530   CL 107 09/14/2013 0530   CO2 32 09/14/2013 0530   GLUCOSE 91 09/14/2013 0530   BUN 44* 09/14/2013 0530   CREATININE 2.89* 09/14/2013 0530   CALCIUM 8.8 09/14/2013 0530   GFRNONAA 14* 09/14/2013 0530   GFRAA 16* 09/14/2013 0530    CMP     Component Value Date/Time   NA 151* 09/14/2013 0530   K 3.9 09/14/2013 0530   CL 107 09/14/2013 0530   CO2 32 09/14/2013 0530   GLUCOSE 91 09/14/2013 0530   BUN 44* 09/14/2013 0530   CREATININE 2.89* 09/14/2013 0530   CALCIUM 8.8 09/14/2013 0530   PROT 6.0 09/14/2013 0530   ALBUMIN 2.3* 09/14/2013 0530   AST 8 09/14/2013 0530   ALT <5 09/14/2013 0530   ALKPHOS 67 09/14/2013 0530   BILITOT 0.7 09/14/2013 0530   GFRNONAA 14* 09/14/2013 0530   GFRAA 16* 09/14/2013 0530         Assessment and Plan: 1. Code Status   DNR/DNI-comfort is main focus of care   2. Symptom Control: 1. Anxiety/Agitation: Ativan  1 mg IV every 4 hrs prn 2. Pain/Dyspnea: Dilaudid 0.5 mg IV every 15 min prn 3. Dysphagia; comfort feeds and fluids as tolerated, family aware of aspiration risk 4. Bowel Regimen:Dulcolax supp prn   3. Psycho/Social:  Emotional support offered to family at bedside.  Continued conversation regarding natural trajectory and expectations at EOL   4. Spiritual:  Lunette Stands is engaged   5. Disposition:  Expect a hospital death.  Prognosis is likely hrs to days  Patient Documents Completed or Given: Document Given Completed  Advanced Directives Pkt    MOST yes   DNR  yes  Gone from My Sight    Hard Choices yes     Time In Time Out Total Time Spent with Patient Total Overall Time  0830 0905 35 min 35 min    Greater than 50%  of this time was spent counseling and coordinating care related to the above assessment and plan.  Lorinda Creed NP  Palliative Medicine Team Team Phone # [phone] Pager [phone]  Discussed with Dr Isidoro Donning 1

## 2013-09-14 NOTE — Progress Notes (Signed)
Notified central telemetry and Elink of transfer to 6N. Removed pt from monitor. Pain medicine and due meds given. Family at bedside. Pt transferred to 6N27.

## 2013-09-15 DIAGNOSIS — I509 Heart failure, unspecified: Secondary | ICD-10-CM

## 2013-09-15 DIAGNOSIS — I5023 Acute on chronic systolic (congestive) heart failure: Secondary | ICD-10-CM

## 2013-09-15 MED ORDER — HYDROMORPHONE HCL PF 1 MG/ML IJ SOLN
0.5000 mg | INTRAMUSCULAR | Status: DC | PRN
  Administered 2013-09-15 – 2013-09-16 (×7): 0.5 mg via INTRAVENOUS
  Filled 2013-09-15 (×7): qty 1

## 2013-09-15 NOTE — Progress Notes (Signed)
Patient's family and visitors were educated regarding patient's isolation status.  They refuse to wear isolation gowns.  Print out education given to family.

## 2013-09-15 NOTE — Plan of Care (Signed)
Problem: Phase I Progression Outcomes Goal: Other Phase II Outcomes/Goals Outcome: Completed/Met Date Met:  09/15/13 Patient on Isolation Precautions (Contact) for MRSA. Family educated regarding appropriate precautions.

## 2013-09-15 NOTE — Progress Notes (Signed)
Patient ID: [name]  female  ZOX:[id number]    DOB: 11/22/1927    DOA: 09/10/2013  PCP: Eloisa Northern, MD  Assessment/Plan: Principal Problem:   Acute respiratory failure Active Problems:   Acute renal failure   Acidosis   Palliative care encounter   Dyspnea   Dysphagia, unspecified(787.20)   Weakness generalized  The patient seen and examined, admitted for recurrent pneumonia secondary to aspiration, bilateral, HCAP/ sepsis. Currently on comfort care goals Palliative meeting done on 9/7 At the time of my encounter, patient having the difficulty breathing, will change to Dilaudid 0.5 mg IV every 15 minutes for pain/dyspnea and consider continuous drip if requiring 4 doses over one hour, continue Ativan 1 mg IV every 4 hours as needed  Discussed in detail with patient's daughter and granddaughter that side, poor prognosis, likely hours-day  DVT Prophylaxis:  Code Status: Comfort Care  Family Communication: Discussed with daughter and granddaughter at the bedside  Disposition:  Consultants:  Palliative medicine  Procedures:  none  Antibiotics:  dc'ed cefepime    Subjective: Patient seen and examined this morning, having difficulty breathing and gasping, daughter and the granddaughter at the bedside.  Objective: Weight change:   Intake/Output Summary (Last 24 hours) at 09/15/13 1118 Last data filed at 09/15/13 0559  Gross per 24 hour  Intake    200 ml  Output    325 ml  Net   -125 ml   Blood pressure 174/72, pulse 96, temperature 99.9 F (37.7 C), temperature source Axillary, resp. rate 12, height 5\' 3"  (1.6 m), weight 67.1 kg (147 lb 14.9 oz), SpO2 84.00%.  Physical Exam: General: Unresponsive, gasping, difficulty breathing CVS: S1-S2 clear Chest: Diffuse wheezing bilaterally, Abdomen: soft  nondistended, normal bowel sounds  Extremities: no cyanosis, clubbing or edema noted bilaterally Neuro: Does not follow commands  Lab Results: Basic  Metabolic Panel:  Recent Labs Lab 09/10/13 1640  09/13/13 0430 09/13/13 0433 09/14/13 0530  NA  --   < >  --  149* 151*  K  --   < >  --  3.4* 3.9  CL  --   < >  --  106 107  CO2  --   < >  --  31 32  GLUCOSE  --   < >  --  145* 91  BUN  --   < >  --  42* 44*  CREATININE  --   < >  --  2.68* 2.89*  CALCIUM  --   < >  --  8.8 8.8  MG 1.6  --  1.7  --   --   PHOS 3.7  --   --   --   --   < > = values in this interval not displayed. Liver Function Tests:  Recent Labs Lab 09/13/13 0433 09/14/13 0530  AST 9 8  ALT 6 <5  ALKPHOS 87 67  BILITOT 1.0 0.7  PROT 6.1 6.0  ALBUMIN 2.4* 2.3*   No results found for this basename: LIPASE, AMYLASE,  in the last 168 hours No results found for this basename: AMMONIA,  in the last 168 hours CBC:  Recent Labs Lab 09/10/13 0830  09/12/13 0540 09/13/13 0433  WBC 7.5  < > 7.3 9.6  NEUTROABS 6.6  --   --   --   HGB 10.9*  < > 8.0* 8.1*  HCT 33.3*  < > 24.8* 24.9*  MCV 82.4  < > 83.2 81.9  PLT 129*  < >  93* 113*  < > = values in this interval not displayed. Cardiac Enzymes:  Recent Labs Lab 09/10/13 0830  TROPONINI <0.30   BNP: No components found with this basename: POCBNP,  CBG:  Recent Labs Lab 09/12/13 2019 09/13/13 0044 09/13/13 0417 09/13/13 0823 09/13/13 1236  GLUCAP 131* 148* 141* 114* 111*     Micro Results: Recent Results (from the past 240 hour(s))  CULTURE, BLOOD (ROUTINE X 2)     Status: None   Collection Time    09/10/13 10:01 AM      Result Value Ref Range Status   Specimen Description BLOOD RIGHT FINGER   Final   Special Requests BOTTLES DRAWN AEROBIC AND ANAEROBIC 5CC   Final   Culture  Setup Time     Final   Value: 09/10/2013 14:28     Performed at [facility]   Culture     Final   Value: STAPHYLOCOCCUS SPECIES (COAGULASE NEGATIVE)     Note: SUSCEPTIBILITIES PERFORMED ON PREVIOUS CULTURE WITHIN THE LAST 5 DAYS.     Note: Gram Stain Report Called to,Read Back By and Verified With:  MELISSA HAWDESHELL 09/11/13 @ 11:17AM BY RUSCOE     Performed at [facility]   Report Status 09/13/2013 FINAL   Final  CULTURE, BLOOD (ROUTINE X 2)     Status: None   Collection Time    09/10/13 10:10 AM      Result Value Ref Range Status   Specimen Description BLOOD RIGHT THUMB   Final   Special Requests BOTTLES DRAWN AEROBIC AND ANAEROBIC 5CC   Final   Culture  Setup Time     Final   Value: 09/10/2013 14:28     Performed at [facility]   Culture     Final   Value: STAPHYLOCOCCUS SPECIES (COAGULASE NEGATIVE)     Note: RIFAMPIN AND GENTAMICIN SHOULD NOT BE USED AS SINGLE DRUGS FOR TREATMENT OF STAPH INFECTIONS.     Note: Gram Stain Report Called to,Read Back By and Verified With: MELISSA HAWDESHELL 09/11/13 @ 11:17AM BY RUSCOE A.     Performed at [facility]   Report Status 09/13/2013 FINAL   Final   Organism ID, Bacteria STAPHYLOCOCCUS SPECIES (COAGULASE NEGATIVE)   Final  URINE CULTURE     Status: None   Collection Time    09/10/13 12:03 PM      Result Value Ref Range Status   Specimen Description URINE, CATHETERIZED   Final   Special Requests NONE   Final   Culture  Setup Time     Final   Value: 09/10/2013 18:01     Performed at [facility] Count     Final   Value: NO GROWTH     Performed at [facility]   Culture     Final   Value: NO GROWTH     Performed at [facility]   Report Status 09/11/2013 FINAL   Final  MRSA PCR SCREENING     Status: Abnormal   Collection Time    09/11/13  1:06 AM      Result Value Ref Range Status   MRSA by PCR POSITIVE (*) NEGATIVE Final   Comment:            The GeneXpert MRSA Assay (FDA     approved for NASAL specimens     only), is one component of a     comprehensive MRSA colonization  surveillance program. It is not     intended to diagnose MRSA     infection nor to guide or     monitor treatment for     MRSA infections.     RESULT CALLED TO, READ BACK BY AND VERIFIED  WITHLuther Redo RN [id number] [phone] GREEN R    Studies/Results: Dg Chest Port 1 View  09/13/2013   CLINICAL DATA:  Pneumonia  EXAM: PORTABLE CHEST - 1 VIEW  COMPARISON:  09/11/2013  FINDINGS: Left hemithorax remains completely opacified. Worsened airspace opacities in the right lung. Right-sided PICC line tip: SVC. Atherosclerotic aortic arch. Degenerative arthropathy at the glenohumeral joints. Thoracic spondylosis.  IMPRESSION: 1. Continued complete opacification of the left hemithorax whether by diffuse consolidation or atelectasis. Worsening multifocal pneumonia in the right lung.   Electronically Signed   By: Herbie Baltimore M.D.   On: 09/13/2013 09:47   Dg Chest Port 1 View  09/11/2013   CLINICAL DATA:  Follow-up pneumonia  EXAM: PORTABLE CHEST - 1 VIEW  COMPARISON:  09/10/2013  FINDINGS: Complete opacification of the left hemithorax, unchanged. Abrupt cutoff of the left mainstem bronchus. While the rapid progression favors superimposed atelectasis/infection, an underlying central obstructing mass remains possible.  Multifocal patchy opacities in the right upper and lower lobes, suspicious for pneumonia. No pneumothorax.  Mild leftward cardiomediastinal shift.  IMPRESSION: Multifocal right lung pneumonia.  Complete opacification of the left hemithorax, unchanged. Central obstructing mass is not excluded.   Electronically Signed   By: Charline Bills M.D.   On: 09/11/2013 08:21   Dg Chest Port 1 View  09/10/2013   CLINICAL DATA:  Confirm line placement.  EXAM: PORTABLE CHEST - 1 VIEW  COMPARISON:  09/10/2013  FINDINGS: Right PICC line is in place with the tip at the cavoatrial junction. Complete white out of the left hemithorax, worsened since prior study. Patchy airspace disease in the right lung, increasing since prior study. Very low lung volumes.  IMPRESSION: Right PICC line tip at the cavoatrial junction.  Complete opacification of the left hemithorax and increasing patchy airspace disease in the  right lung.   Electronically Signed   By: Charlett Nose M.D.   On: 09/10/2013 15:51   Dg Chest Port 1 View  09/10/2013   CLINICAL DATA:  Respiratory distress.  EXAM: PORTABLE CHEST - 1 VIEW  COMPARISON:  June 26, 2010.  FINDINGS: Stable cardiomediastinal silhouette. Large left upper lobe airspace opacity is noted consistent with pneumonia. Mild right upper lobe opacity is noted concerning for pneumonia. Narrowing of the right subacromial space is noted suggesting rotator cuff injury. No pneumothorax or significant pleural effusion is noted.  IMPRESSION: New large left upper lobe pneumonia. Mild right upper lobe opacity is noted concerning for pneumonia.   Electronically Signed   By: Roque Lias M.D.   On: 09/10/2013 08:36    Medications: Scheduled Meds: . albuterol  2.5 mg Nebulization Q4H  . antiseptic oral rinse  7 mL Mouth Rinse q12n4p  . chlorhexidine  15 mL Mouth Rinse BID  . Chlorhexidine Gluconate Cloth  6 each Topical Q0600  . mupirocin ointment  1 application Nasal BID  . scopolamine  1 patch Transdermal Q72H  . sodium chloride  10-40 mL Intracatheter Q12H      LOS: 5 days   Nivin Braniff M.D. [facility] 09/15/2013, 11:18 AM Pager: [phone]  If 7PM-7AM, please contact night-coverage [email or web address] Password [email or web address]  **Disclaimer: This note was dictated with voice recognition software. Similar  sounding words can inadvertently be transcribed and this note may contain transcription errors which may not have been corrected upon publication of note.**

## 2013-09-15 NOTE — Progress Notes (Signed)
Patient opens eyes to communication. Oxygen level in low 60's . Non-rebreather started by respiratory. Oxygen increased to low 80's. Comfort provided by staff. Patient offered to be given bath, but family declined. Daughter and grand daughter by the bed side.

## 2013-09-16 ENCOUNTER — Inpatient Hospital Stay (HOSPITAL_COMMUNITY)
Admission: AD | Admit: 2013-09-16 | Discharge: 2013-10-07 | DRG: 871 | Disposition: E | Source: Ambulatory Visit | Attending: Internal Medicine | Admitting: Internal Medicine

## 2013-09-16 DIAGNOSIS — A419 Sepsis, unspecified organism: Secondary | ICD-10-CM | POA: Diagnosis present

## 2013-09-16 DIAGNOSIS — G309 Alzheimer's disease, unspecified: Secondary | ICD-10-CM

## 2013-09-16 DIAGNOSIS — I6789 Other cerebrovascular disease: Secondary | ICD-10-CM | POA: Diagnosis present

## 2013-09-16 DIAGNOSIS — R0681 Apnea, not elsewhere classified: Secondary | ICD-10-CM | POA: Diagnosis not present

## 2013-09-16 DIAGNOSIS — I69959 Hemiplegia and hemiparesis following unspecified cerebrovascular disease affecting unspecified side: Secondary | ICD-10-CM

## 2013-09-16 DIAGNOSIS — I672 Cerebral atherosclerosis: Secondary | ICD-10-CM | POA: Diagnosis present

## 2013-09-16 DIAGNOSIS — F0280 Dementia in other diseases classified elsewhere without behavioral disturbance: Secondary | ICD-10-CM | POA: Diagnosis present

## 2013-09-16 DIAGNOSIS — Z880 Allergy status to penicillin: Secondary | ICD-10-CM | POA: Diagnosis not present

## 2013-09-16 DIAGNOSIS — I129 Hypertensive chronic kidney disease with stage 1 through stage 4 chronic kidney disease, or unspecified chronic kidney disease: Secondary | ICD-10-CM | POA: Diagnosis present

## 2013-09-16 DIAGNOSIS — N183 Chronic kidney disease, stage 3 unspecified: Secondary | ICD-10-CM | POA: Diagnosis present

## 2013-09-16 DIAGNOSIS — I4891 Unspecified atrial fibrillation: Secondary | ICD-10-CM | POA: Diagnosis present

## 2013-09-16 DIAGNOSIS — S88119A Complete traumatic amputation at level between knee and ankle, unspecified lower leg, initial encounter: Secondary | ICD-10-CM

## 2013-09-16 DIAGNOSIS — IMO0002 Reserved for concepts with insufficient information to code with codable children: Secondary | ICD-10-CM | POA: Diagnosis not present

## 2013-09-16 DIAGNOSIS — J690 Pneumonitis due to inhalation of food and vomit: Secondary | ICD-10-CM | POA: Diagnosis present

## 2013-09-16 DIAGNOSIS — Z515 Encounter for palliative care: Secondary | ICD-10-CM | POA: Diagnosis not present

## 2013-09-16 DIAGNOSIS — J9600 Acute respiratory failure, unspecified whether with hypoxia or hypercapnia: Secondary | ICD-10-CM | POA: Diagnosis present

## 2013-09-16 DIAGNOSIS — I509 Heart failure, unspecified: Secondary | ICD-10-CM | POA: Diagnosis present

## 2013-09-16 DIAGNOSIS — F0150 Vascular dementia without behavioral disturbance: Secondary | ICD-10-CM | POA: Diagnosis present

## 2013-09-16 DIAGNOSIS — S78119A Complete traumatic amputation at level between unspecified hip and knee, initial encounter: Secondary | ICD-10-CM

## 2013-09-16 DIAGNOSIS — I639 Cerebral infarction, unspecified: Secondary | ICD-10-CM | POA: Diagnosis present

## 2013-09-16 DIAGNOSIS — Z66 Do not resuscitate: Secondary | ICD-10-CM | POA: Diagnosis present

## 2013-09-16 MED ORDER — CHLORHEXIDINE GLUCONATE 0.12 % MT SOLN
15.0000 mL | Freq: Two times a day (BID) | OROMUCOSAL | Status: DC
  Administered 2013-09-17 – 2013-09-19 (×6): 15 mL via OROMUCOSAL
  Filled 2013-09-16 (×4): qty 15

## 2013-09-16 MED ORDER — MUPIROCIN 2 % EX OINT
1.0000 "application " | TOPICAL_OINTMENT | Freq: Two times a day (BID) | CUTANEOUS | Status: DC
  Administered 2013-09-17 – 2013-09-20 (×7): 1 via NASAL

## 2013-09-16 MED ORDER — HYDROMORPHONE BOLUS VIA INFUSION
0.5000 mg | INTRAVENOUS | Status: DC | PRN
  Filled 2013-09-16: qty 1

## 2013-09-16 MED ORDER — SODIUM CHLORIDE 0.9 % IV SOLN: 0.2500 mg/h | INTRAVENOUS | Status: DC

## 2013-09-16 MED ORDER — LORAZEPAM 2 MG/ML IJ SOLN
1.0000 mg | INTRAMUSCULAR | Status: DC | PRN
  Administered 2013-09-16: 1 mg via INTRAVENOUS

## 2013-09-16 MED ORDER — SODIUM CHLORIDE 0.9 % IV SOLN
0.5000 mg/h | INTRAVENOUS | Status: DC
  Administered 2013-09-16 – 2013-09-17 (×4): 0.25 mg/h via INTRAVENOUS
  Administered 2013-09-17: 0.5 mg/h via INTRAVENOUS
  Administered 2013-09-17: 0.25 mg/h via INTRAVENOUS
  Administered 2013-09-19 – 2013-09-20 (×7): 0.5 mg/h via INTRAVENOUS
  Filled 2013-09-16 (×4): qty 2.5

## 2013-09-16 MED ORDER — WHITE PETROLATUM GEL
Status: AC
  Administered 2013-09-16: 0.2
  Filled 2013-09-16: qty 5

## 2013-09-16 MED ORDER — LORAZEPAM 2 MG/ML IJ SOLN
INTRAMUSCULAR | Status: AC
  Filled 2013-09-16: qty 1

## 2013-09-16 MED ORDER — ONDANSETRON HCL 4 MG/2ML IJ SOLN: 4.0000 mg | Freq: Four times a day (QID) | INTRAMUSCULAR | Status: DC | PRN

## 2013-09-16 MED ORDER — BISACODYL 10 MG RE SUPP: 10.0000 mg | Freq: Every day | RECTAL | Status: DC | PRN

## 2013-09-16 MED ORDER — LORAZEPAM 2 MG/ML IJ SOLN: 1.0000 mg | INTRAMUSCULAR | Status: AC | PRN

## 2013-09-16 MED ORDER — SCOPOLAMINE 1 MG/3DAYS TD PT72
1.0000 | MEDICATED_PATCH | TRANSDERMAL | Status: DC
  Administered 2013-09-19: 1.5 mg via TRANSDERMAL
  Filled 2013-09-16 (×3): qty 1

## 2013-09-16 MED ORDER — HYDROMORPHONE BOLUS VIA INFUSION: 0.5000 mg | INTRAVENOUS | Status: DC | PRN

## 2013-09-16 NOTE — Progress Notes (Signed)
Patient ID: [name]  female  ZOX:[id number]    DOB: 1927-02-18    DOA: 09/10/2013  PCP: Eloisa Northern, MD  Assessment/Plan: Principal Problem:   Acute respiratory failure Active Problems:   Acute renal failure   Acidosis   Palliative care encounter   Dyspnea   Dysphagia, unspecified(787.20)   Weakness generalized  Patient admitted for recurrent pneumonia secondary to aspiration, bilateral, HCAP/ sepsis. Currently on comfort care goals Palliative meeting done on 9/7 - Patient appears comfortable, daughter and granddaughter at the bedside - Will continue current plan of management Dilaudid 0.5 mg IV every 15 minutes for pain/dyspnea and consider continuous drip if requiring 4 doses over one hour, continue Ativan 1 mg IV every 4 hours as needed  Discussed in detail with patient's daughter and granddaughter that side, poor prognosis,expected inhospital death. Will check with case management if patient would be a candidate for GIP.?  DVT Prophylaxis:  Code Status: Comfort Care  Family Communication: Discussed with daughter at the bedside  Disposition:  Consultants:  Palliative medicine  Procedures:  none  Antibiotics:  dc'ed cefepime    Subjective: Patient seen and examined this morning, appears comfortable, unresponsive   Objective: Weight change: 4.1 kg (9 lb 0.6 oz)  Intake/Output Summary (Last 24 hours) at Oct 04, 2013 1105 Last data filed at 09/15/13 1719  Gross per 24 hour  Intake      0 ml  Output    275 ml  Net   -275 ml   Blood pressure 122/67, pulse 96, temperature 101 F (38.3 C), temperature source Oral, resp. rate 14, height  (1.6 m), weight 70.9 kg (156 lb 4.9 oz), SpO2 75.00%.  Physical Exam: General: Unresponsive, comfortable CVS: S1-S2 clear Chest: Decreased breath sounds Abdomen: soft  nondistended, normal bowel sounds  Extremities: no cyanosis, clubbing or edema noted bilaterally Neuro: Does not follow commands  Lab  Results: Basic Metabolic Panel:  Recent Labs Lab 09/10/13 1640  09/13/13 0430 09/13/13 0433 09/14/13 0530  NA  --   < >  --  149* 151*  K  --   < >  --  3.4* 3.9  CL  --   < >  --  106 107  CO2  --   < >  --  31 32  GLUCOSE  --   < >  --  145* 91  BUN  --   < >  --  42* 44*  CREATININE  --   < >  --  2.68* 2.89*  CALCIUM  --   < >  --  8.8 8.8  MG 1.6  --  1.7  --   --   PHOS 3.7  --   --   --   --   < > = values in this interval not displayed. Liver Function Tests:  Recent Labs Lab 09/13/13 0433 09/14/13 0530  AST 9 8  ALT 6 <5  ALKPHOS 87 67  BILITOT 1.0 0.7  PROT 6.1 6.0  ALBUMIN 2.4* 2.3*   No results found for this basename: LIPASE, AMYLASE,  in the last 168 hours No results found for this basename: AMMONIA,  in the last 168 hours CBC:  Recent Labs Lab 09/10/13 0830  09/12/13 0540 09/13/13 0433  WBC 7.5  < > 7.3 9.6  NEUTROABS 6.6  --   --   --   HGB 10.9*  < > 8.0* 8.1*  HCT 33.3*  < > 24.8* 24.9*  MCV 82.4  < >  83.2 81.9  PLT 129*  < > 93* 113*  < > = values in this interval not displayed. Cardiac Enzymes:  Recent Labs Lab 09/10/13 0830  TROPONINI <0.30   BNP: No components found with this basename: POCBNP,  CBG:  Recent Labs Lab 09/12/13 2019 09/13/13 0044 09/13/13 0417 09/13/13 0823 09/13/13 1236  GLUCAP 131* 148* 141* 114* 111*     Micro Results: Recent Results (from the past 240 hour(s))  CULTURE, BLOOD (ROUTINE X 2)     Status: None   Collection Time    09/10/13 10:01 AM      Result Value Ref Range Status   Specimen Description BLOOD RIGHT FINGER   Final   Special Requests BOTTLES DRAWN AEROBIC AND ANAEROBIC 5CC   Final   Culture  Setup Time     Final   Value: 09/10/2013 14:28     Performed at [facility]   Culture     Final   Value: STAPHYLOCOCCUS SPECIES (COAGULASE NEGATIVE)     Note: SUSCEPTIBILITIES PERFORMED ON PREVIOUS CULTURE WITHIN THE LAST 5 DAYS.     Note: Gram Stain Report Called to,Read Back By and  Verified With: MELISSA HAWDESHELL 09/11/13 @ 11:17AM BY RUSCOE     Performed at [facility]   Report Status 09/13/2013 FINAL   Final  CULTURE, BLOOD (ROUTINE X 2)     Status: None   Collection Time    09/10/13 10:10 AM      Result Value Ref Range Status   Specimen Description BLOOD RIGHT THUMB   Final   Special Requests BOTTLES DRAWN AEROBIC AND ANAEROBIC 5CC   Final   Culture  Setup Time     Final   Value: 09/10/2013 14:28     Performed at [facility]   Culture     Final   Value: STAPHYLOCOCCUS SPECIES (COAGULASE NEGATIVE)     Note: RIFAMPIN AND GENTAMICIN SHOULD NOT BE USED AS SINGLE DRUGS FOR TREATMENT OF STAPH INFECTIONS.     Note: Gram Stain Report Called to,Read Back By and Verified With: MELISSA HAWDESHELL 09/11/13 @ 11:17AM BY RUSCOE A.     Performed at [facility]   Report Status 09/13/2013 FINAL   Final   Organism ID, Bacteria STAPHYLOCOCCUS SPECIES (COAGULASE NEGATIVE)   Final  URINE CULTURE     Status: None   Collection Time    09/10/13 12:03 PM      Result Value Ref Range Status   Specimen Description URINE, CATHETERIZED   Final   Special Requests NONE   Final   Culture  Setup Time     Final   Value: 09/10/2013 18:01     Performed at [facility] Count     Final   Value: NO GROWTH     Performed at [facility]   Culture     Final   Value: NO GROWTH     Performed at [facility]   Report Status 09/11/2013 FINAL   Final  MRSA PCR SCREENING     Status: Abnormal   Collection Time    09/11/13  1:06 AM      Result Value Ref Range Status   MRSA by PCR POSITIVE (*) NEGATIVE Final   Comment:            The GeneXpert MRSA Assay (FDA     approved for NASAL specimens     only), is one component of a  comprehensive MRSA colonization     surveillance program. It is not     intended to diagnose MRSA     infection nor to guide or     monitor treatment for     MRSA infections.     RESULT CALLED TO, READ BACK BY  AND VERIFIED WITHLuther Redo RN [id number] [phone] GREEN R    Studies/Results: Dg Chest Port 1 View  09/13/2013   CLINICAL DATA:  Pneumonia  EXAM: PORTABLE CHEST - 1 VIEW  COMPARISON:  09/11/2013  FINDINGS: Left hemithorax remains completely opacified. Worsened airspace opacities in the right lung. Right-sided PICC line tip: SVC. Atherosclerotic aortic arch. Degenerative arthropathy at the glenohumeral joints. Thoracic spondylosis.  IMPRESSION: 1. Continued complete opacification of the left hemithorax whether by diffuse consolidation or atelectasis. Worsening multifocal pneumonia in the right lung.   Electronically Signed   By: Herbie Baltimore M.D.   On: 09/13/2013 09:47   Dg Chest Port 1 View  09/11/2013   CLINICAL DATA:  Follow-up pneumonia  EXAM: PORTABLE CHEST - 1 VIEW  COMPARISON:  09/10/2013  FINDINGS: Complete opacification of the left hemithorax, unchanged. Abrupt cutoff of the left mainstem bronchus. While the rapid progression favors superimposed atelectasis/infection, an underlying central obstructing mass remains possible.  Multifocal patchy opacities in the right upper and lower lobes, suspicious for pneumonia. No pneumothorax.  Mild leftward cardiomediastinal shift.  IMPRESSION: Multifocal right lung pneumonia.  Complete opacification of the left hemithorax, unchanged. Central obstructing mass is not excluded.   Electronically Signed   By: Charline Bills M.D.   On: 09/11/2013 08:21   Dg Chest Port 1 View  09/10/2013   CLINICAL DATA:  Confirm line placement.  EXAM: PORTABLE CHEST - 1 VIEW  COMPARISON:  09/10/2013  FINDINGS: Right PICC line is in place with the tip at the cavoatrial junction. Complete white out of the left hemithorax, worsened since prior study. Patchy airspace disease in the right lung, increasing since prior study. Very low lung volumes.  IMPRESSION: Right PICC line tip at the cavoatrial junction.  Complete opacification of the left hemithorax and increasing patchy airspace  disease in the right lung.   Electronically Signed   By: Charlett Nose M.D.   On: 09/10/2013 15:51   Dg Chest Port 1 View  09/10/2013   CLINICAL DATA:  Respiratory distress.  EXAM: PORTABLE CHEST - 1 VIEW  COMPARISON:  June 26, 2010.  FINDINGS: Stable cardiomediastinal silhouette. Large left upper lobe airspace opacity is noted consistent with pneumonia. Mild right upper lobe opacity is noted concerning for pneumonia. Narrowing of the right subacromial space is noted suggesting rotator cuff injury. No pneumothorax or significant pleural effusion is noted.  IMPRESSION: New large left upper lobe pneumonia. Mild right upper lobe opacity is noted concerning for pneumonia.   Electronically Signed   By: Roque Lias M.D.   On: 09/10/2013 08:36    Medications: Scheduled Meds: . albuterol  2.5 mg Nebulization Q4H  . antiseptic oral rinse  7 mL Mouth Rinse q12n4p  . chlorhexidine  15 mL Mouth Rinse BID  . scopolamine  1 patch Transdermal Q72H  . sodium chloride  10-40 mL Intracatheter Q12H      LOS: 6 days   RAI,RIPUDEEP M.D. [facility] 09-21-13, 11:05 AM Pager: [phone]  If 7PM-7AM, please contact night-coverage [email or web address] Password [email or web address]  **Disclaimer: This note was dictated with voice recognition software. Similar sounding words can inadvertently be transcribed and this note may contain transcription  errors which may not have been corrected upon publication of note.**

## 2013-09-16 NOTE — Progress Notes (Signed)
Chaplain called to patient room. Chaplain walked in to find a room with fifteen family members. Patient family specifically asked for a Catering manager. Patient was non-responsive, but listening to "amazing Delorise Shiner" gospel music. Chaplain offered for the family to sing a verse of "Amazing Delorise Shiner." Chaplain offered prayer and blessing. Chaplain made family aware of Spiritual Care services. Will follow as needed.   10-03-13 2000  Clinical Encounter Type  Visited With Patient and family together  Visit Type Initial;Spiritual support  Referral From Nurse  Spiritual Encounters  Spiritual Needs Sacred text;Prayer;Ritual;Emotional;Grief support  Stress Factors  Patient Stress Factors None identified  Family Stress Factors Loss  Charmian Muff, Chaplain 9:02 PM 10-03-13

## 2013-09-16 NOTE — Progress Notes (Signed)
Discharge readmit orders placed. Phillips Odor will assume attending under [facility] care. Contact [phone] or [phone].  Anderson Malta, DO Palliative Medicine

## 2013-09-16 NOTE — Progress Notes (Signed)
Inpatient [facility] Rm 6N27 L. Salais [facility] RN Visit M. Konrad Dolores, RN  Request received from [facility] for evaluation for GIP level of care eligibility; per attending MD pt prognosis hours to days, seen by PMT who is in agreement. Patient seen at bedside at 3:30 pm chart and patient information reviewed with [facility] Medical Director and eligibility confirmed. 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 At time of visit pt unresponsive to touch, daughter reports she was somewhat responsive to voice earlier in the day; sighing respirations with occasional pause in breathing; RR =18 on 4LNC O2 sat earlier in day 75%; extremities warm Comfort medications in place per MAR-Scopolamine patch, Lorazepam and Dilaudid IV PRN.   Pt required multiple doses of PRN IV Dilaudid for increased dyspnea/WOB today and there is consideration for continuous Dilaudid infusion by PMT Dr Phillips Odor. Staff RN Einar Crow monitoring pt closely for effectiveness of symptom management. Multiple family members surrounding patient at bedside; spoke with daughter, Gardiner Ramus who voiced their main goal if for a peaceful death; Gardiner Ramus shared she is appreciative of staff monitoring her mother's comfort and is aware she can ask at any time for medication should she feel her mother is having breathing issues or any other discomfort. Gardiner Ramus is aware Forrestine Him [facility] SW will follow up to complete registration visit, and that [facility] Team will continue to follow patient/family daily along with PMT attending. Please contact [facility] at [phone] for any hospice needs.  Please contact PMT Attending Dr Phillips Odor at [phone] for symptom management needs and at time of death.  Valente David, RN MSN, [facility] Liaison [phone]

## 2013-09-16 NOTE — Care Management Note (Signed)
  Page 1 of 1   September 30, 2013     2:43:20 PM CARE MANAGEMENT NOTE 2013/09/30  Patient:  ZARYA, LASSEIGNE   Account Number:  [id number]  Date Initiated:  09/13/2013  Documentation initiated by:  Vance Peper  Subjective/Objective Assessment:     Action/Plan:   Anticipated DC Date:     Anticipated DC Plan:           Choice offered to / List presented to:             Status of service:  In process, will continue to follow Medicare Important Message given?  YES (If response is "NO", the following Medicare IM given date fields will be blank) Date Medicare IM given:  09/13/2013 Medicare IM given by:  Vance Peper Date Additional Medicare IM given:  09-30-13 Additional Medicare IM given by:  Ronny Flurry  Discharge Disposition:    Per UR Regulation:    If discussed at Long Length of Stay Meetings, dates discussed:   09/30/2013    Comments: 09-30-2013 Daughter has agreed to [facility].  Dr Isidoro Donning  and bedside nurse aware . Ronny Flurry RN BSN [phone]     09-30-13 Bedside nurse called , patient's daughter Ms Rudi Heap requested to speak to NCM again regarding GIP . Spoke to Ms Rudi Heap and her cousin at patient's bedside. Explained GIP . Ms Rudi Heap more receptive to palliative and hospice being involved .  Her cousin has had great experiences with hospice and palliative in the past .  Choices offered for GIP . Ms Rudi Heap requesting to speak to [facility] [place] , before committing 100% . Referral given to [facility] and [facility] [place] . Dr Isidoro Donning aware . If Ms Rudi Heap does want GIP Dr Isidoro Donning wishes to sign off to Palliative Medicine team.  Ronny Flurry RN BSN [phone]    09/30/2013 Consult for GIP . Confirmed [employer] insurance does have GIP benefit . Spoke with patient's daughter Carmin Richmond . Explained GIP , Ms Rudi Heap does not want Palliative Care or Hospice seeing her mother .  Dr Isidoro Donning aware. Ronny Flurry RN BSN [phone]

## 2013-09-16 NOTE — Discharge Summary (Signed)
Physician Discharge Summary  Patient ID: [name] MRN: [id number] DOB/AGE: [birth year] 80 y.o.  Admit date: 09/10/2013 Discharge date: 09/14/2013  Primary Care Physician:  Eloisa Northern, MD  Discharge Diagnoses:    . Acute respiratory failure Aspiration pneumonia, bilateral HCAP/sepsis Acute renal failure  Acidosis  Dyspnea  Dysphagia, unspecified(787.20)  Weakness generalized  Consults: Palliative medicine   Recommendations for Outpatient Follow-up:  Patient is currently comfortable  Allergies:   Allergies  Allergen Reactions  . Iodinated Diagnostic Agents     Per MAR  . Penicillins     Per  MAR     Discharge Medications:   Medication List    STOP taking these medications       amantadine 100 MG capsule  Commonly known as:  SYMMETREL     amLODipine 10 MG tablet  Commonly known as:  NORVASC     bisacodyl 10 MG suppository  Commonly known as:  DULCOLAX     camphor-menthol lotion  Commonly known as:  SARNA     carvedilol 12.5 MG tablet  Commonly known as:  COREG     cloNIDine 0.1 MG tablet  Commonly known as:  CATAPRES     clopidogrel 75 MG tablet  Commonly known as:  PLAVIX     Cranberry 425 MG Caps     diphenhydrAMINE 12.5 MG/5ML liquid  Commonly known as:  BENADRYL     ferrous sulfate 325 (65 FE) MG tablet     furosemide 20 MG tablet  Commonly known as:  LASIX     gabapentin 300 MG capsule  Commonly known as:  NEURONTIN     HYDROcodone-acetaminophen 5-325 MG per tablet  Commonly known as:  NORCO/VICODIN     ipratropium-albuterol 0.5-2.5 (3) MG/3ML Soln  Commonly known as:  DUONEB     levalbuterol 45 MCG/ACT inhaler  Commonly known as:  XOPENEX HFA     loratadine 10 MG tablet  Commonly known as:  CLARITIN     LORazepam 0.5 MG tablet  Commonly known as:  ATIVAN  Replaced by:  LORazepam 2 MG/ML injection     multivitamins ther. w/minerals Tabs tablet     nitroGLYCERIN 0.4 MG SL tablet  Commonly known as:  NITROSTAT      ondansetron 4 MG tablet  Commonly known as:  ZOFRAN     pantoprazole 40 MG tablet  Commonly known as:  PROTONIX     PATADAY 0.2 % Soln  Generic drug:  Olopatadine HCl     polyvinyl alcohol 1.4 % ophthalmic solution  Commonly known as:  LIQUIFILM TEARS     potassium chloride SA 20 MEQ tablet  Commonly known as:  K-DUR,KLOR-CON     risperiDONE 1 MG tablet  Commonly known as:  RISPERDAL     senna-docusate 8.6-50 MG per tablet  Commonly known as:  Senokot-S     sertraline 25 MG tablet  Commonly known as:  ZOLOFT     sodium chloride 0.65 % Soln nasal spray  Commonly known as:  OCEAN     traMADol 50 MG tablet  Commonly known as:  ULTRAM     vitamin C 500 MG tablet  Commonly known as:  ASCORBIC ACID     Vitamin D (Ergocalciferol) 50000 UNITS Caps capsule  Commonly known as:  DRISDOL      TAKE these medications       LORazepam 2 MG/ML injection  Commonly known as:  ATIVAN  Inject 0.5 mLs (1 mg total) into the vein every  4 (four) hours as needed for anxiety.         Brief H and P: For complete details please refer to admission H and P, but in brief 80 year old female, resides at [facility]. H/o af, severe PAD, PD w/ dementia, prob dysphagia and recent PNA. Admitted from SNF on 9/4 w/ working dx of acute resp failure in setting of HCAP vs aspiration. Patient was admitted by critical care service.    Hospital Course:  Acute respiratory failure in the setting of bilateral L>R pneumonia: aspiration vs HCAP/sepsis :Patient was admitted for recurrent pneumonia likely secondary to aspiration. She had total opacification of left lung due to mucus plugging. Patient was admitted by critical care service to the step down unit. Patient did not have significant improvement on the serial chest x-rays. [facility] and discussed with patient's family and who would okay with BiPAP but no intubation. Subsequently blood cultures came back positive for gram-positive cocci in clusters, staph coag negative.   Patient was transferred to MedSurg and was continued on broad-spectrum antibiotics. Urine Legionella and urine strep antigen were negative. However due to no significant improvement, palliative medicine consult was obtained and patient was transitioned to comfort care goals of care with DNR status   Acute on chronic renal failure; baseline creatinine 1.8 however creatinine worsened to 3.2 at likely due to sepsis. Creatinine improved slightly with IV fluids, last 2.89 on 9/8.   Anion gap/NAP Metabolic acidosis: in setting of sepsis and renal failure   Acute encephalopathy superimposed on underlying dementia ? parkinsons dz : Patient was provided with supportive care  Sacral decub ulcer : Wound care consult was obtained  Dysphagia and protein cal malnutrition    Patient was accepted by palliative medicine, Dr. Phillips Odor for inpatient hospice, expected in hospital death.  Day of Discharge BP 122/67  Pulse 96  Temp(Src) 101 F (38.3 C) (Oral)  Resp 14  Ht  (1.6 m)  Wt 70.9 kg (156 lb 4.9 oz)  BMI 27.70 kg/m2  SpO2 75%  Physical Exam: General: Unresponsive, comfortable  CVS: S1-S2 clear  Chest: Decreased breath sounds  Abdomen: soft nondistended, normal bowel sounds  Extremities: no cyanosis, clubbing or edema noted bilaterally  Neuro: Does not follow commands   The results of significant diagnostics from this hospitalization (including imaging, microbiology, ancillary and laboratory) are listed below for reference.    LAB RESULTS: Basic Metabolic Panel:  Recent Labs Lab 09/10/13 1640  09/13/13 0430 09/13/13 0433 09/14/13 0530  NA  --   < >  --  149* 151*  K  --   < >  --  3.4* 3.9  CL  --   < >  --  106 107  CO2  --   < >  --  31 32  GLUCOSE  --   < >  --  145* 91  BUN  --   < >  --  42* 44*  CREATININE  --   < >  --  2.68* 2.89*  CALCIUM  --   < >  --  8.8 8.8  MG 1.6  --  1.7  --   --   PHOS 3.7  --   --   --   --   < > = values in this interval not  displayed. Liver Function Tests:  Recent Labs Lab 09/13/13 0433 09/14/13 0530  AST 9 8  ALT 6 <5  ALKPHOS 87 67  BILITOT 1.0 0.7  PROT 6.1  6.0  ALBUMIN 2.4* 2.3*   No results found for this basename: LIPASE, AMYLASE,  in the last 168 hours No results found for this basename: AMMONIA,  in the last 168 hours CBC:  Recent Labs Lab 09/10/13 0830  09/12/13 0540 09/13/13 0433  WBC 7.5  < > 7.3 9.6  NEUTROABS 6.6  --   --   --   HGB 10.9*  < > 8.0* 8.1*  HCT 33.3*  < > 24.8* 24.9*  MCV 82.4  < > 83.2 81.9  PLT 129*  < > 93* 113*  < > = values in this interval not displayed. Cardiac Enzymes:  Recent Labs Lab 09/10/13 0830  TROPONINI <0.30   BNP: No components found with this basename: POCBNP,  CBG:  Recent Labs Lab 09/13/13 0823 09/13/13 1236  GLUCAP 114* 111*    Significant Diagnostic Studies:  Dg Chest Port 1 View  09/11/2013   CLINICAL DATA:  Follow-up pneumonia  EXAM: PORTABLE CHEST - 1 VIEW  COMPARISON:  09/10/2013  FINDINGS: Complete opacification of the left hemithorax, unchanged. Abrupt cutoff of the left mainstem bronchus. While the rapid progression favors superimposed atelectasis/infection, an underlying central obstructing mass remains possible.  Multifocal patchy opacities in the right upper and lower lobes, suspicious for pneumonia. No pneumothorax.  Mild leftward cardiomediastinal shift.  IMPRESSION: Multifocal right lung pneumonia.  Complete opacification of the left hemithorax, unchanged. Central obstructing mass is not excluded.   Electronically Signed   By: Charline Bills M.D.   On: 09/11/2013 08:21   Dg Chest Port 1 View  09/10/2013   CLINICAL DATA:  Confirm line placement.  EXAM: PORTABLE CHEST - 1 VIEW  COMPARISON:  09/10/2013  FINDINGS: Right PICC line is in place with the tip at the cavoatrial junction. Complete white out of the left hemithorax, worsened since prior study. Patchy airspace disease in the right lung, increasing since prior study.  Very low lung volumes.  IMPRESSION: Right PICC line tip at the cavoatrial junction.  Complete opacification of the left hemithorax and increasing patchy airspace disease in the right lung.   Electronically Signed   By: Charlett Nose M.D.   On: 09/10/2013 15:51   Dg Chest Port 1 View  09/10/2013   CLINICAL DATA:  Respiratory distress.  EXAM: PORTABLE CHEST - 1 VIEW  COMPARISON:  June 26, 2010.  FINDINGS: Stable cardiomediastinal silhouette. Large left upper lobe airspace opacity is noted consistent with pneumonia. Mild right upper lobe opacity is noted concerning for pneumonia. Narrowing of the right subacromial space is noted suggesting rotator cuff injury. No pneumothorax or significant pleural effusion is noted.  IMPRESSION: New large left upper lobe pneumonia. Mild right upper lobe opacity is noted concerning for pneumonia.   Electronically Signed   By: Roque Lias M.D.   On: 09/10/2013 08:36       Disposition and Follow-up:  Inpatient hospice     Time spent on Discharge: 30 mins  Signed:   RAI,RIPUDEEP M.D. [facility] 09/12/2013, 4:15 PM Pager: [phone]   **Disclaimer: This note was dictated with voice recognition software. Similar sounding words can inadvertently be transcribed and this note may contain transcription errors which may not have been corrected upon publication of note.**

## 2013-09-17 MED ORDER — IPRATROPIUM-ALBUTEROL 0.5-2.5 (3) MG/3ML IN SOLN
3.0000 mL | RESPIRATORY_TRACT | Status: DC | PRN
  Administered 2013-09-17 – 2013-09-20 (×11): 3 mL via RESPIRATORY_TRACT
  Filled 2013-09-17 (×12): qty 3

## 2013-09-17 MED ORDER — SODIUM CHLORIDE 0.9 % IJ SOLN
10.0000 mL | Freq: Two times a day (BID) | INTRAMUSCULAR | Status: DC
  Administered 2013-09-17 – 2013-09-20 (×2): 10 mL

## 2013-09-17 MED ORDER — HYDROMORPHONE BOLUS VIA INFUSION
1.0000 mg | INTRAVENOUS | Status: DC | PRN
  Administered 2013-09-17 – 2013-09-19 (×5): 1 mg via INTRAVENOUS
  Filled 2013-09-17: qty 1

## 2013-09-17 MED ORDER — IPRATROPIUM-ALBUTEROL 0.5-2.5 (3) MG/3ML IN SOLN: 3.0000 mL | RESPIRATORY_TRACT | Status: DC

## 2013-09-17 MED ORDER — SODIUM CHLORIDE 0.9 % IJ SOLN
10.0000 mL | INTRAMUSCULAR | Status: DC | PRN
  Administered 2013-09-17: 10 mL

## 2013-09-17 MED ORDER — ATROPINE SULFATE 1 % OP SOLN
2.0000 [drp] | OPHTHALMIC | Status: DC | PRN
  Administered 2013-09-18: 2 [drp] via SUBLINGUAL
  Administered 2013-09-18: 4 [drp] via SUBLINGUAL
  Filled 2013-09-17: qty 2

## 2013-09-17 NOTE — Progress Notes (Signed)
09/17/13 1045  Clinical Encounter Type  Visited With Patient and family together;Health care provider  Visit Type Initial;Spiritual support;Patient actively dying  Referral From Chaplain  Spiritual Encounters  Spiritual Needs Prayer;Grief support;Emotional  Stress Factors  Patient Stress Factors Other (Comment) [facility] Ar[facility]ements)  Family Stress Factors Loss of control;Major life changes;Loss   Chaplain was referred to patient by another Chaplain. Chaplain visited with the patient, the patient's youngest daughter, and the patient's granddaughter. Patient comes from a large family and has had family support throughout her time at the hospital. Patient currently has a brother who is also actively dying in a medical facility in [place], according to patient's daughter. Patient's daughter spoke a lot about her long journey as her mother's caregiver. Patient's daughter described the care giving journey as having several frustrating moments. Patient's daughter expressed sadness and difficulty in seeing her mother actively dying. Both the patient's daughter and granddaughter were tearful during Chaplain's visit. Chaplain was asked to pray and fulfilled that request. Chaplain will continue to provide emotional and spiritual support as needed. Tawnie Ehresman, Tommi Emery, Chaplain  11:30 AM

## 2013-09-17 NOTE — Progress Notes (Signed)
[facility] [place] Work Note [phone]) Chart reviewed and met with patient's youngest daughter/POA Jeani Hawking and several other family members at bedside for initial social work visit. Jeani Hawking shared some history of illness/decline and frustrations with navigating healthcare system. While she expresses shock and disbelief that patient could have declined so rapidly, she openly acknowledges her mother's approaching end of life and desire for her not to suffer or linger. She is very invested in caregiving role and shared examples of going to great lengths to care and advocate for patient. Acknowledged her efforts to juggle many roles and manage family dynamics for almost ten years. Patient comes from large family and has eight living children. Many have visited and said their good byes. Multiple family members have experienced multiple losses. They appear to draw strength from each other as they share their stories and realize similarities. Jeani Hawking expressed appreciation for care patient is receiving and hospital chaplain support. She declines [facility] chaplain support due to needs are being met by hospital. Jeani Hawking is aware [facility] weekend staff will visit over weekend and Dr. Hilma Favors will see daily. Hope to followup Monday. Thank you. Erling Conte LCSW [phone]  Please contact [facility] at [phone] over weekend for hospice needs. Please contact Dr. Hilma Favors at [phone] for symptom management needs or at time of death.

## 2013-09-17 NOTE — Progress Notes (Signed)
Hospice called and given update on patients status.

## 2013-09-17 NOTE — H&P (Signed)
Palliative Medicine Team at [facility] ([facility]) Admission Date: 09/17/2013   Patient Name: [name]  DOB: 1927-04-03  MRN: [id number]  Age / Sex: 80 y.o., female   PCP: Garwin Brothers, MD Referring Physician: Acquanetta Chain, DO  Active Problems: Active Problems:   Acute, but ill-defined, cerebrovascular disease   HPI/Reason for Consultation: [place] is a 80 y.o. Patient with prior history of large Right MCA stroke in 2012 that left her with significant left sided deficits, worsening vascular dementia and led to an overal decline in her functional status over time. She was admitted on 9/4 acute respiratory failure in setting of probable aspiration PNA and sepsis. Palliative met with family on 9/7 and she was transitioned to full comfort care. GIP hospice admission requested.   Primary Contacts: HCPOA: Yes, individual(s) contacted:  Daughter   Advance Directive: No     Code Status Orders        Start     Ordered   09/19/2013 1718  Do not attempt resuscitation (DNR)   Continuous    Question Answer Comment  In the event of cardiac or respiratory ARREST Do not call a "code blue"   In the event of cardiac or respiratory ARREST Do not perform Intubation, CPR, defibrillation or ACLS   In the event of cardiac or respiratory ARREST Use medication by any route, position, wound care, and other measures to relive pain and suffering. May use oxygen, suction and manual treatment of airway obstruction as needed for comfort.      09/19/2013 1717      I have reviewed the medical record, interviewed the patient and family, and examined the patient. The following aspects are pertinent.  Past Medical History  Diagnosis Date  . CVA (cerebral infarction)   . Diastolic heart failure   . Anemia   . Atrial fibrillation   . Acute on chronic kidney disease, stage 3   . Hypertension   . Hx of BKA   . Kidney stones   . Pancreatitis   . Urinary tract  infection     History   Social History  . Marital Status: Widowed    Spouse Name: N/A    Number of Children: N/A  . Years of Education: N/A   Social History Main Topics  . Smoking status: Never Smoker   . Smokeless tobacco: Never Used  . Alcohol Use: No  . Drug Use: No  . Sexual Activity: Not on file   Other Topics Concern  . Not on file   Social History Narrative   Lives at [facility]. Pt with bka. Hx of stroke with slurred speech and left sided hemiplegia. Needs assistance with amubulation, eating, dressig, and bathing.     No family history on file. Scheduled Meds: . chlorhexidine  15 mL Mouth Rinse BID  . mupirocin ointment  1 application Nasal BID  . scopolamine  1 patch Transdermal Q72H   Continuous Infusions: . HYDROmorphone 0.25 mg/hr (09/10/2013 1845)   PRN Meds:.bisacodyl, HYDROmorphone, ipratropium-albuterol, LORazepam, ondansetron (ZOFRAN) IV Allergies  Allergen Reactions  . Iodinated Diagnostic Agents     Per MAR  . Penicillins     Per  MAR    Labs and Data Reviewed:     Component Value Date/Time   WBC 9.6 09/13/2013 0433   HGB 8.1* 09/13/2013 0433   HCT 24.9* 09/13/2013 0433   PLT 113* 09/13/2013 0433   MCV 81.9 09/13/2013 0433   NEUTROABS 6.6 09/10/2013  0830   LYMPHSABS 0.5* 09/10/2013 0830   MONOABS 0.2 09/10/2013 0830   EOSABS 0.1 09/10/2013 0830   BASOSABS 0.0 09/10/2013 0830      Component Value Date/Time   NA 151* 09/14/2013 0530   K 3.9 09/14/2013 0530   CL 107 09/14/2013 0530   CO2 32 09/14/2013 0530   BUN 44* 09/14/2013 0530   CREATININE 2.89* 09/14/2013 0530   GLUCOSE 91 09/14/2013 0530   CALCIUM 8.8 09/14/2013 0530   AST 8 09/14/2013 0530   ALT <5 09/14/2013 0530   ALKPHOS 67 09/14/2013 0530   BILITOT 0.7 09/14/2013 0530   PROT 6.0 09/14/2013 0530   ALBUMIN 2.3* 09/14/2013 0530    Vital Signs: BP 130/62  Pulse 104  Temp(Src) 97.5 F (36.4 C) (Axillary)  Resp 16  SpO2 86% There were no vitals filed for this visit.  Physical Exam:  General  Appearance: very frail, unresponsive  Eyes: normal HEENT: very dry MM, neck is stiff, upper airway secretions Lungs: +Rhonchi throughout CV: Irregular Tachy Abdomen: soft, +edema Extremities: + edema Skin:+skin tears echymoses Psych: not agitated   Assessment/Prognosis:  Primary Diagnoses/Hospice Dx:  1. Late effect CVA 436.0  Active Symptoms: 1. Moderate Dyspnea in setting of PNA, Aspiration 2. Pain due to immobility, OA, contractures 3. Agitation, terminal  Patient requires aggressive, intensive treatment to control pain and symptoms. Care can not feasibly be provided in any other setting.  Prognosis: hours-days  PPS: 10   Plan: 1. Started low dose Dilaudid infusion at 0.94m/hr with 0.565mbolus every 30 minutes for dyspnea and pain 2. IV Ativan 51m43mor agitation 3. Scopalamine for secretions 4. Duonebs for dyspnea   Transition Planning:  Per Hospice Team/ CSW Spiritual Care and Bereavement Planning: Per Hospice Team/CSW/Chaplain    Time In: 4:30 Time Out: 5:20 Total Time: 50 min Greater than 50%  of this time was spent counseling and coordinating care related to the above assessment and plan.  Signed by:  GOLRoma SchanzO  09/17/2013, 9:01 AM  Please contact Palliative Medicine Team phone at [phone] questions and concerns.

## 2013-09-17 NOTE — Progress Notes (Signed)
Palliative Care Team at [facility] Progress Note   SUBJECTIVE: Appears much more comfortable. Unresponsive. No grimace or distress. Short periods of apnea noted.  OBJECTIVE: Vital Signs: BP 130/62  Pulse 104  Temp(Src) 97.5 F (36.4 C) (Axillary)  Resp 16  SpO2 86%   Intake and Output: 09/10 0701 - 09/11 0700 In: -  Out: 20 [Urine:20]  Physical Exam: General: Vital signs reviewed and noted. Very frail. Pale, approaching EOL  Head: Normocephalic, atraumatic.  Lungs:  +wheezing, shallow  Heart: irregular  Abdomen:  BS normoactive. Soft, Nondistended, non-tender.  No masses or organomegaly.  Extremities: ++ edema. Left arm contracture with splint    Allergies  Allergen Reactions  . Iodinated Diagnostic Agents     Per MAR  . Penicillins     Per  MAR    Medications: Scheduled Meds:  . chlorhexidine  15 mL Mouth Rinse BID  . mupirocin ointment  1 application Nasal BID  . scopolamine  1 patch Transdermal Q72H  . sodium chloride  10-40 mL Intracatheter Q12H    Continuous Infusions: . HYDROmorphone 0.5 mg/hr (09/17/13 1555)    PRN Meds: atropine, bisacodyl, HYDROmorphone, ipratropium-albuterol, LORazepam, ondansetron (ZOFRAN) IV, sodium chloride  Stool Softner: yes  Palliative Performance Scale: 10 %    ASSESSMENT/ PLAN: 80 yo woman dying of Aspiration PNA, Sepsis, in setting of Prior CVA and vascular dementia. Poor functional status at baseline PTA. Now admitted under GIP hospice benefit for EOL symptom mangement. Patient requires aggressive, intensive treatment to control pain and/or symptoms. Care can not feasibly be provided in any other setting.  1. Pain/Dyspnea: Increased her Dilaudid infusion earlier today for dyspnea reported by nursing. Now at 0.5mg /hr with a  bolus- she is very well controlled on my bedside evaluation.  2. PS/Spirtual Support: Listened to family concerns about teh course of events and their disappointment in SNF care..they replayed  her health history and are therapeutically talking out their concerns.  We discussed prolonged dying and the physical and spiritual aspects of that process.  Prognosis: hours-days  Greater than 50%  of this time was spent counseling and coordinating care related to the above assessment and plan.   Edsel Petrin, DO  09/17/2013, 5:19 PM  Please contact Palliative Medicine Team phone at [phone] for questions and concerns.

## 2013-09-17 NOTE — Progress Notes (Signed)
Inpatient [facility] Rm 6N27 [facility] [place], RN Visit -M. Konrad Dolores, RN  Related admission to [facility] diagnosis CVA; pt is a DNR code status  Arrived to room - pt's daughter Gardiner Ramus and granddaughter had been visiting at bedside with Lunette Stands; daughter Herbalist to stay. Daughter was sharing her 5+year journey as her mother's caregiver; recounting many frustrations with the health system and stating her disbelief that her mother's time with them is now very limited. Engineer, maintenance and care giving/support for her mother and emotional support offered. Granddaughter shared her observation of pt moaning with repositioning during the early morning. Discussed with granddaughter and daughter s/sx of discomfort/pain (facial grimacing, moaning, changes in respiration, restlessness, etc), as well as currently ordered medications available. Daughter shared she had requested nebulizer treatments overnight and feels this also has helped patient.   At time of visit pt with eyes closed, RR=16 with 5 second pauses noted; HR irregular, tachycardic, RUE some edema noted; LUE in splint. Pt initially unresponsive to voice, however writer present when staff began personal care and pt briefly opened eyes, grimacing, began moaning, with grunting respirations. These s/sx were also seen by pt's family and staff RN Hansel Starling who administered a bolus dose of IV Dilaudid. Patient remains on continuous IV Dilaudid drip at 0.25 mg/hr; also discussed use of IV Ativan for increased anxiety; pt's daughter shared she had requested this yesterday when she observed pt with LE restlessness/twitching. Pt continues to require skilled nursing assessment for non-verbal s/sx pain/respiratory distress. Assured family that the 6N staff will continue to evaluate and assess effectiveness of symptom management; they are aware to notify staff with questions or concerns  [facility] will continue to follow daily and  collaborate with PMT attending Dr Phillips Odor. Please call [facility] @ [phone]-  with any hospice needs.  Thank you.    Valente David, RN MSN [facility]   [facility] Liaison  [phone])

## 2013-09-18 NOTE — Progress Notes (Signed)
GIP visit MC 6N27 Si [facility]- [facility] [place]- Ulis Rias RN  Related Admission for hospice diagnosis CVA. Patient is a DNR. Meet Larita Fife and other family member at patients door they were headed to take a break. Larita Fife states that patient was wet and had been cleaned up and suctioned and patient was resting peacefully. Grand-daughter and best friend at bedside. Grand-daughter tearful but everyone seems to be greiving appropriately. Friend stated patient was talking about her mother and she was ready to go home. Patient raised 8 children by herself. Patient appeared to be comfortable, unresponsive and on a Dilaudid PCA with bolus. No acute distress needed and family has no needs at this time.   Please call [facility] @ [phone]- with any hospice needs. Thank you.  Ulis Rias RN [facility] [place]

## 2013-09-19 ENCOUNTER — Encounter (HOSPITAL_COMMUNITY): Payer: Self-pay | Admitting: *Deleted

## 2013-09-19 NOTE — Progress Notes (Signed)
GIP visit [facility] 6N27 Si Gaul- [facility] [place]- Ulis Rias RN   Related Admission for hospice diagnosis CVA. Patient is a DNR. Daughter Larita Fife is at bedside. Patient is unresponsive. Daughter states that when they move her she opens her eyes. No acute distress noted patient still on Dilaudid drip and appears comfortable. Spoke with daughter about leaving patients room and she stated that she would never leave her mother alone that she knows sometimes they are waiting for family to leave but she doesn't want to leave her, validated daughters feelings. No needs at this time, Dr. Phillips Odor at bedside at end of visit.   Please call [facility] @ [phone]- with any hospice needs. Thank you.  Ulis Rias RN [facility] [place]

## 2013-10-07 DIAGNOSIS — 419620001 Death: Secondary | SNOMED CT

## 2013-10-07 NOTE — Discharge Summary (Signed)
Death Summary  [name] UJW:[id number] DOB: 10/21/1927 DOA: 09-17-13  PCP: Eloisa Northern, MD PCP/Office notified: YES  Admit date: Sep 17, 2013 Date of Death: Sep 21, 2013  Final Diagnoses:  1. Sepsis 2. Aspiration PNA 3. Prior Stroke with residual deficits-left hemiparesis 4. Vascular Dementia   Hospital Course:   [name] is a 80 y.o. Native American woman with prior history of large Right MCA stroke in 2012 that left her with significant left sided deficits, worsening vascular dementia and led to an overal decline in her functional status over time. Her immobility was from stroke deficits and prior Right BKA,  She was admitted on 9/4 acute respiratory failure in setting of probable aspiration PNA and sepsis. Given her advanced age, and multiple medical co-morbidities she was transitioned to full comfort care and admitted under [facility] hospice for management of dyspnea and pain at EOL. She was started on a dilaudid infusion that was titrated for comfort and IV ativan for agitation, Scopolamine TD and atropine were used to control secretions. PRN O2 and nebulizers were used for dyspnea in addition to dilaudid boluses.  I was present at the bed side while patient took her last breaths- she had a prolonged period of agonal apnea but expired at 6:22PM. I pronounced and provided comfort to family. Hospital chaplain called per family request for prayer.  Death Certificate was completed and signed-and instructions given for the certificate to go with her body.   Time: 50 minutes  Signed:  Nimo Verastegui  [facility] 21-Sep-2013, 6:48 PM

## 2013-10-07 NOTE — Progress Notes (Addendum)
Inpatient [facility] 6N27 [facility], RN visit-K Merilynn Finland RN  Related admission to [facility] Diagnosis CVA; Pt is DNR code. Patient seen at bedside, daughter Gardiner Ramus and niece present during visit. Gardiner Ramus appeared somewhat anxious, stating she needed to leave, and now has her niece present to "release her" but felt conflicted about leaving her mother. Pt lying in bed, facial muscles relaxed, mouth breathing, nasal cannula in place delivering oxygen at 4 L. Pt appears pale, with relaxed extremities, leg cool to touch, upper extremities warm. Gardiner Ramus reports that pt had a large BM earlier and needs her sacral dressing changed, she was confident that the staff would be replacing the dressing, she felt her mother needed "to recover" from the turning and cleaning she had experienced. Gardiner Ramus also reported that her mother was able to open her eyes and "try to talk' with visitors earlier. She also felt that her mother had been experiencing pain earlier and was pleased that the bolus dose was increased. Per chart review bolus is hydromorphone  q 15 minutes PRN, changed from Q 30 minutes PRN changed on 9/11 . Basal rate of hydromorphone at 0.5mg /hr. Some upper airway secretions noted, pt does have scopolamine patch in place and atropine gtts ordered Q 2 hrs PRN. Staff RN Annabelle Harman to follow up. Annabelle Harman reports that patient had a large liquid stool and when turned her respirations decreased, pt resumed regular pattern after turn was complete. Respirations at visit shallow, 12-10, no use of accessory muscles. Pt had recvd a neb treatment at Lillian's request prior to writers visit. Pt remained with eyes closed, no response to touch or gentle voice. Gardiner Ramus stated she feels her  Mother "is very close" and expressed acceptance of her mother's impending death. Pt continues to require skilled nursing assessment for non-verbal s/sx pain/respiratory distress.  [facility] will continue to follow daily and  collaborate with PMT attending Dr Phillips Odor. Please call [facility] @ [phone]- with any hospice needs. Thank you.   Dayna Barker RN, BSN, [facility] Liaison [phone] c

## 2013-10-07 NOTE — Progress Notes (Signed)
Patient time of death 40 with family at bedside along with dr Phillips Odor.  Post mortem care delayed to give family time with patient and chaplain arrived per family request.

## 2013-10-07 NOTE — Progress Notes (Signed)
When I arrived pt's daughter and granddaughter were bedside and were tearful as pt had just passed. I waited w/them and provided pastoral presence and spt as they talked about pt and family as other family members and friends arrived. Family requested prayer and also a prayer of blessing for pt.  Pt was [employer] as well as family. Pt's daughter said she had been by pt's bedside for 11 days and only lft today to get her belongings for the SNF and pt passed. Pt's family was lrg and supportive of one another. They were very appreciative of prayer and appropriately tearful. Provided emotional spt as long as appropriate and then encouraged them to continue to visit with their loved one as long as they needed. Marjory Lies Chaplain  09/26/13 1900  Clinical Encounter Type  Visited With Family

## 2013-10-07 DEATH — deceased
